# Patient Record
Sex: Female | Born: 2000 | Hispanic: Yes | Marital: Single | Smoking: Never smoker
Health system: Southern US, Community
[De-identification: ages and names within clinical notes are randomized; demographics above are authoritative.]

## PROBLEM LIST (undated history)

## (undated) DIAGNOSIS — Z789 Other specified health status: Secondary | ICD-10-CM

## (undated) HISTORY — PX: NO PAST SURGERIES: SHX2092

---
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
## 2019-02-01 ENCOUNTER — Encounter: Payer: Commercial Managed Care - PPO | Admitting: Family Medicine

## 2019-03-05 ENCOUNTER — Other Ambulatory Visit: Payer: Self-pay

## 2019-03-05 DIAGNOSIS — N946 Dysmenorrhea, unspecified: Secondary | ICD-10-CM

## 2019-03-05 MED ORDER — NORGESTREL-ETHINYL ESTRADIOL 0.3-30 MG-MCG PO TABS: 1.0000 | ORAL_TABLET | Freq: Every day | ORAL | 1 refills | Status: DC

## 2019-05-01 ENCOUNTER — Ambulatory Visit (INDEPENDENT_AMBULATORY_CARE_PROVIDER_SITE_OTHER): Payer: Commercial Managed Care - PPO | Admitting: Family Medicine

## 2019-05-01 ENCOUNTER — Encounter: Payer: Self-pay | Admitting: Family Medicine

## 2019-05-01 ENCOUNTER — Other Ambulatory Visit: Payer: Self-pay

## 2019-05-01 VITALS — BP 110/74 | HR 78 | Temp 98.8°F | Resp 18 | Ht 65.0 in | Wt 212.2 lb

## 2019-05-01 DIAGNOSIS — L83 Acanthosis nigricans: Secondary | ICD-10-CM

## 2019-05-01 DIAGNOSIS — Z3041 Encounter for surveillance of contraceptive pills: Secondary | ICD-10-CM

## 2019-05-01 DIAGNOSIS — Z0001 Encounter for general adult medical examination with abnormal findings: Secondary | ICD-10-CM | POA: Diagnosis not present

## 2019-05-01 DIAGNOSIS — Z833 Family history of diabetes mellitus: Secondary | ICD-10-CM

## 2019-05-01 DIAGNOSIS — Z113 Encounter for screening for infections with a predominantly sexual mode of transmission: Secondary | ICD-10-CM

## 2019-05-01 DIAGNOSIS — D573 Sickle-cell trait: Secondary | ICD-10-CM

## 2019-05-01 DIAGNOSIS — E669 Obesity, unspecified: Secondary | ICD-10-CM | POA: Insufficient documentation

## 2019-05-01 DIAGNOSIS — Z6835 Body mass index (BMI) 35.0-35.9, adult: Secondary | ICD-10-CM

## 2019-05-01 DIAGNOSIS — Z0000 Encounter for general adult medical examination without abnormal findings: Secondary | ICD-10-CM

## 2019-05-01 DIAGNOSIS — E6609 Other obesity due to excess calories: Secondary | ICD-10-CM

## 2019-05-01 MED ORDER — NORGESTREL-ETHINYL ESTRADIOL 0.3-30 MG-MCG PO TABS: 1.0000 | ORAL_TABLET | Freq: Every day | ORAL | 3 refills | Status: DC

## 2019-05-01 NOTE — Patient Instructions (Addendum)
Schedule for fasting labs We will call with results

## 2019-05-01 NOTE — Progress Notes (Signed)
   Subjective:    Patient ID: Michelle Carr, female    DOB: 2001-03-22, 18 y.o.   MRN: [id number]  Patient presents for Annual Exam   Pt here for CPE [facility]- 2nd year, pre-nursing     My Eye doctor- has glasses, has astigmatism   Dentist- up to date    Has allergy shellfish     Taking birth control LMP 5/24 last 3 days, regular    1  Female partner- sexually active      No vitamins or supplements    Has family history of HTN,DM     Has sickle cell trait- told her count was high            Mother also concerned about diabetes as she has noticed a darkening rash on her neck for greater than 6 months. Mitts that she eats out a lot she is not been eating very healthy food she does not exercise      iMMUNIZATIONS UTD   Review Of Systems:  GEN- denies fatigue, fever, weight loss,weakness, recent illness HEENT- denies eye drainage, change in vision, nasal discharge, CVS- denies chest pain, palpitations RESP- denies SOB, cough, wheeze ABD- denies N/V, change in stools, abd pain GU- denies dysuria, hematuria, dribbling, incontinence MSK- denies joint pain, muscle aches, injury Neuro- denies headache, dizziness, syncope, seizure activity       Objective:    BP 110/74   Pulse 78   Temp 98.8 F (37.1 C)   Resp 18   Ht 5\' 5"  (1.651 m)   Wt 212 lb 3.2 oz (96.3 kg)   LMP 04/21/2019   BMI 35.31 kg/m  GEN- NAD, alert and oriented x3 HEENT- PERRL, EOMI, non injected sclera, pink conjunctiva, MMM, oropharynx clear Neck- Supple, no thyromegaly CVS- RRR, no murmur RESP-CTAB ABD-NABS,soft,NT,ND Skin mild acanthosis posterior neck Psych- normal affect and mood  EXT- No edema Pulses- Radial, DP- 2+        Assessment & Plan:      Problem List Items Addressed This Visit      Unprioritized   Family history of diabetes mellitus   Obesity    Other Visit Diagnoses    Routine general medical examination at a health care facility    -  Primary   Screen for STD  (sexually transmitted disease)       Sickle cell trait (HCC)       Encounter for surveillance of contraceptive pills       Relevant Medications   norgestrel-ethinyl estradiol (LO/OVRAL) 0.3-30 MG-MCG tablet      Note: This dictation was prepared with Dragon dictation along with smaller phrase technology. Any transcriptional errors that result from this process are unintentional.

## 2019-05-03 LAB — C. TRACHOMATIS/N. GONORRHOEAE RNA
C. trachomatis RNA, TMA: NOT DETECTED
N. gonorrhoeae RNA, TMA: NOT DETECTED

## 2019-05-03 LAB — TRICHOMONAS VAGINALIS, PROBE AMP: Trichomonas vaginalis RNA: NOT DETECTED

## 2019-05-06 ENCOUNTER — Other Ambulatory Visit: Payer: Commercial Managed Care - PPO

## 2019-05-06 ENCOUNTER — Other Ambulatory Visit: Payer: Self-pay

## 2019-05-06 DIAGNOSIS — Z113 Encounter for screening for infections with a predominantly sexual mode of transmission: Secondary | ICD-10-CM

## 2019-05-06 DIAGNOSIS — Z0000 Encounter for general adult medical examination without abnormal findings: Secondary | ICD-10-CM

## 2019-05-06 DIAGNOSIS — Z833 Family history of diabetes mellitus: Secondary | ICD-10-CM

## 2019-05-06 DIAGNOSIS — D573 Sickle-cell trait: Secondary | ICD-10-CM

## 2019-05-06 DIAGNOSIS — L83 Acanthosis nigricans: Secondary | ICD-10-CM

## 2019-05-06 DIAGNOSIS — Z3041 Encounter for surveillance of contraceptive pills: Secondary | ICD-10-CM

## 2019-05-07 LAB — COMPREHENSIVE METABOLIC PANEL
AG Ratio: 1.5 (calc) (ref 1.0–2.5)
ALT: 15 U/L (ref 5–32)
AST: 17 U/L (ref 12–32)
Albumin: 4.3 g/dL (ref 3.6–5.1)
Alkaline phosphatase (APISO): 68 U/L (ref 36–128)
BUN: 8 mg/dL (ref 7–20)
CO2: 27 mmol/L (ref 20–32)
Calcium: 9.8 mg/dL (ref 8.9–10.4)
Chloride: 103 mmol/L (ref 98–110)
Creat: 0.67 mg/dL (ref 0.50–1.00)
Globulin: 2.8 g/dL (calc) (ref 2.0–3.8)
Glucose, Bld: 129 mg/dL — ABNORMAL HIGH (ref 65–99)
Potassium: 4.6 mmol/L (ref 3.8–5.1)
Sodium: 139 mmol/L (ref 135–146)
Total Bilirubin: 0.5 mg/dL (ref 0.2–1.1)
Total Protein: 7.1 g/dL (ref 6.3–8.2)

## 2019-05-07 LAB — CBC WITH DIFFERENTIAL/PLATELET
Absolute Monocytes: 428 cells/uL (ref 200–950)
Basophils Absolute: 50 cells/uL (ref 0–200)
Basophils Relative: 0.6 %
Eosinophils Absolute: 151 cells/uL (ref 15–500)
Eosinophils Relative: 1.8 %
HCT: 39.3 % (ref 35.0–45.0)
Hemoglobin: 13.4 g/dL (ref 11.7–15.5)
Lymphs Abs: 2150 cells/uL (ref 850–3900)
MCH: 28.8 pg (ref 27.0–33.0)
MCHC: 34.1 g/dL (ref 32.0–36.0)
MCV: 84.3 fL (ref 80.0–100.0)
MPV: 11.1 fL (ref 7.5–12.5)
Monocytes Relative: 5.1 %
Neutro Abs: 5620 cells/uL (ref 1500–7800)
Neutrophils Relative %: 66.9 %
Platelets: 277 10*3/uL (ref 140–400)
RBC: 4.66 10*6/uL (ref 3.80–5.10)
RDW: 12 % (ref 11.0–15.0)
Total Lymphocyte: 25.6 %
WBC: 8.4 10*3/uL (ref 3.8–10.8)

## 2019-05-07 LAB — LIPID PANEL
Cholesterol: 157 mg/dL (ref <170)
HDL: 44 mg/dL — ABNORMAL LOW (ref >45)
LDL Cholesterol (Calc): 93 mg/dL (calc) (ref <110)
Non-HDL Cholesterol (Calc): 113 mg/dL (calc) (ref <120)
Total CHOL/HDL Ratio: 3.6 (calc) (ref <5.0)
Triglycerides: 107 mg/dL — ABNORMAL HIGH (ref <90)

## 2019-05-07 LAB — RETICULOCYTES
ABS Retic: 135140 cells/uL — ABNORMAL HIGH (ref 20000–8000)
Retic Ct Pct: 2.9 %

## 2019-05-07 LAB — HEMOGLOBIN A1C
Hgb A1c MFr Bld: 4.8 % of total Hgb (ref <5.7)
Mean Plasma Glucose: 91 (calc)
eAG (mmol/L): 5 (calc)

## 2019-05-07 LAB — IRON: Iron: 107 ug/dL (ref 27–164)

## 2019-05-07 LAB — RPR: RPR Ser Ql: NONREACTIVE

## 2019-05-07 LAB — HIV ANTIBODY (ROUTINE TESTING W REFLEX): HIV 1&2 Ab, 4th Generation: NONREACTIVE

## 2019-11-13 ENCOUNTER — Ambulatory Visit (INDEPENDENT_AMBULATORY_CARE_PROVIDER_SITE_OTHER): Payer: Commercial Managed Care - PPO | Admitting: Family Medicine

## 2019-11-13 ENCOUNTER — Other Ambulatory Visit: Payer: Self-pay

## 2019-11-13 ENCOUNTER — Encounter: Payer: Self-pay | Admitting: Family Medicine

## 2019-11-13 VITALS — BP 118/64 | HR 90 | Temp 98.5°F | Resp 14 | Ht 65.0 in | Wt 216.0 lb

## 2019-11-13 DIAGNOSIS — L600 Ingrowing nail: Secondary | ICD-10-CM

## 2019-11-13 MED ORDER — SULFAMETHOXAZOLE-TRIMETHOPRIM 800-160 MG PO TABS: 1.0000 | ORAL_TABLET | Freq: Two times a day (BID) | ORAL | 0 refills | Status: DC

## 2019-11-13 NOTE — Progress Notes (Signed)
   Subjective:    Patient ID: Michelle Carr, female    DOB: August 15, 2001, 18 y.o.   MRN: [id number]  Patient presents for Nail Issues (L foot great toe and 2cd toe- swelling and leaking pus on sides of nails)  Here with redness and drainage from the site of her left great foot and second toenail.  States that she has had redness and swelling on and off for a while now.  She does get pedicures but will often have in between pedicures.  She has never had pus come out before her last pedicure was 2 weeks ago.  She has some tenderness around the nail but no pain in the actual foot ankle joint or above.  No fever or chills.   Review Of Systems:  GEN- denies fatigue, fever, weight loss,weakness, recent illness CVS- denies chest pain, palpitations RESP- denies SOB, cough, wheezee MSK- denies joint pain, muscle aches, injury Neuro- denies headache, dizziness, syncope, seizure activity       Objective:    BP 118/64   Pulse 90   Temp 98.5 F (36.9 C) (Temporal)   Resp 14   Ht 5\' 5"  (1.651 m)   Wt 216 lb (98 kg)   SpO2 96%   BMI 35.94 kg/m  GEN- NAD, alert and oriented x3 Skin-right foot skin intact no rash nails normal appearing, left great toe erythema on the medial aspect of the great toe ingrown nail tenderness to palpation pus expressed at the side of the nail, second digit swelling around the lateral aspect of the nail mild erythema no pus expressed EXT- No edema Pulses-  DP- 2+        Assessment & Plan:      Problem List Items Addressed This Visit    None    Visit Diagnoses    Ingrown toenail with infection    -  Primary   Infected ingrown nail.  We will have her use Epson salt soaks antibacterial soap.  We will start Bactrim.  I was able to express a small amount of pus left.  Since she has recurrent episodes with her nails and the ingrown appearance recommend lifting up the toenails using dental floss especially the great toe.  And will allow the nail to grow out longer  instead of keeping them cut very short.  We will calm down the infection per above.  If this does not improve her pain then she should return for partial nail removal.   Relevant Medications   sulfamethoxazole-trimethoprim (BACTRIM DS) 800-160 MG tablet      Note: This dictation was prepared with Dragon dictation along with smaller phrase technology. Any transcriptional errors that result from this process are unintentional.

## 2019-11-13 NOTE — Patient Instructions (Signed)
Epson salt soaks Use dental floss to lift up nail Antibiotics as prescribed Call if this does not improve

## 2019-11-20 ENCOUNTER — Emergency Department (HOSPITAL_BASED_OUTPATIENT_CLINIC_OR_DEPARTMENT_OTHER)
Admission: EM | Admit: 2019-11-20 | Discharge: 2019-11-21 | Disposition: A | Discharge status: Home / Self Care | Payer: Commercial Managed Care - PPO | Attending: Emergency Medicine | Admitting: Emergency Medicine

## 2019-11-20 ENCOUNTER — Other Ambulatory Visit: Payer: Self-pay

## 2019-11-20 ENCOUNTER — Encounter (HOSPITAL_BASED_OUTPATIENT_CLINIC_OR_DEPARTMENT_OTHER): Payer: Self-pay | Admitting: *Deleted

## 2019-11-20 DIAGNOSIS — Z79899 Other long term (current) drug therapy: Secondary | ICD-10-CM | POA: Insufficient documentation

## 2019-11-20 DIAGNOSIS — M79675 Pain in left toe(s): Secondary | ICD-10-CM | POA: Diagnosis present

## 2019-11-20 DIAGNOSIS — L03032 Cellulitis of left toe: Secondary | ICD-10-CM | POA: Diagnosis not present

## 2019-11-20 NOTE — ED Triage Notes (Signed)
Pt c/o toe infection left foot started ABX on 12/16, w/o improvement

## 2019-11-20 NOTE — ED Provider Notes (Signed)
[facility] EMERGENCY DEPARTMENT Provider Note   CSN: [id number] Arrival date & time: 11/20/19  2327   History Chief Complaint  Patient presents with  . Wound Infection    Michelle Carr is a 18 y.o. female.  The history is provided by the patient.  She was treated about a week ago for infection in her left great toe and second toe.  The great toe has gotten better.  The second toe has had some slight purulent drainage each day.  Tonight, there was a great deal more drainage as well as some bleeding.  Bleeding stopped just prior to her arriving in the ED.  She has had pain in that toe which has improved since it burst today.  He denies fever, chills, sweats.  History reviewed. No pertinent past medical history.  Patient Active Problem List   Diagnosis Date Noted  . Family history of diabetes mellitus 05/01/2019  . Obesity 05/01/2019  . Dysmenorrhea 01/04/2018    History reviewed. No pertinent surgical history.   OB History   No obstetric history on file.     Family History  Problem Relation Age of Onset  . Hyperlipidemia Mother   . Hyperlipidemia Maternal Grandmother   . Diabetes Maternal Grandmother   . Hypertension Maternal Grandmother     Social History   Tobacco Use  . Smoking status: Never Smoker  . Smokeless tobacco: Never Used  Substance Use Topics  . Alcohol use: No  . Drug use: No    Home Medications Prior to Admission medications   Medication Sig Start Date End Date Taking? Authorizing Provider  norgestrel-ethinyl estradiol (LO/OVRAL) 0.3-30 MG-MCG tablet Take 1 tablet by mouth daily. 05/01/19   Fennimore, Velna Hatchet, MD  sulfamethoxazole-trimethoprim (BACTRIM DS) 800-160 MG tablet Take 1 tablet by mouth 2 (two) times daily. 11/13/19   Salley Scarlet, MD    Allergies    Shellfish allergy  Review of Systems   Review of Systems  All other systems reviewed and are negative.   Physical Exam Updated Vital Signs BP 136/81   Pulse 92    Temp 98.2 F (36.8 C)   Resp 16   Ht 5\' 5"  (1.651 m)   Wt 97 kg   LMP 11/16/2019   SpO2 98%   BMI 35.59 kg/m   Physical Exam Vitals and nursing note reviewed.    18 year old female, resting comfortably and in no acute distress. Vital signs are normal. Oxygen saturation is 98%, which is normal. Head is normocephalic and atraumatic. PERRLA, EOMI. Oropharynx is clear. Neck is nontender and supple without adenopathy or JVD. Back is nontender and there is no CVA tenderness. Lungs are clear without rales, wheezes, or rhonchi. Chest is nontender. Heart has regular rate and rhythm without murmur. Abdomen is soft, flat, nontender without masses or hepatosplenomegaly and peristalsis is normoactive. Extremities: No swelling noted of the left first or second toes.  There does appear to be a site where a paronychia had spontaneously drained on the second toe.  There is no erythema or warmth and there is only minimal tenderness.  Remainder of extremity exam is normal. Skin is warm and dry without rash. Neurologic: Mental status is normal, cranial nerves are intact, there are no motor or sensory deficits.  ED Results / Procedures / Treatments    Procedures Procedures  Medications Ordered in ED Medications - No data to display  ED Course  I have reviewed the triage vital signs and the nursing notes.  MDM Rules/Calculators/A&P Paronychia of the left second toe which has spontaneously drained.  No indications for any intervention currently.  Patient is advised to soak in warm water several times a day but advised of possibility of recurrence of paronychia at which time she would need to return for incision and drainage.  Old records are reviewed, and she did have an office visit on 12/16 with diagnosis of an ingrown toenail of the left great toe treated with trimethoprim-sulfamethoxazole.  Final Clinical Impression(s) / ED Diagnoses Final diagnoses:  Paronychia of second toe of left foot     Rx / DC Orders ED Discharge Orders    None       Delora Fuel, MD 48/88/91 2351

## 2019-11-20 NOTE — Discharge Instructions (Addendum)
Soak your foot in warm water 2-3 times a day.  Return if the swelling comes back.

## 2020-02-05 ENCOUNTER — Other Ambulatory Visit: Payer: Self-pay

## 2020-02-05 ENCOUNTER — Encounter: Payer: Self-pay | Admitting: Family Medicine

## 2020-02-05 ENCOUNTER — Ambulatory Visit (INDEPENDENT_AMBULATORY_CARE_PROVIDER_SITE_OTHER): Payer: Commercial Managed Care - PPO | Admitting: Family Medicine

## 2020-02-05 VITALS — BP 116/82 | HR 90 | Temp 98.2°F | Resp 14 | Ht 65.0 in | Wt 218.0 lb

## 2020-02-05 DIAGNOSIS — E6609 Other obesity due to excess calories: Secondary | ICD-10-CM

## 2020-02-05 DIAGNOSIS — Z6836 Body mass index (BMI) 36.0-36.9, adult: Secondary | ICD-10-CM

## 2020-02-05 DIAGNOSIS — Z113 Encounter for screening for infections with a predominantly sexual mode of transmission: Secondary | ICD-10-CM | POA: Diagnosis not present

## 2020-02-05 DIAGNOSIS — Z124 Encounter for screening for malignant neoplasm of cervix: Secondary | ICD-10-CM

## 2020-02-05 DIAGNOSIS — Z0000 Encounter for general adult medical examination without abnormal findings: Secondary | ICD-10-CM | POA: Diagnosis not present

## 2020-02-05 LAB — WET PREP FOR TRICH, YEAST, CLUE

## 2020-02-05 MED ORDER — METRONIDAZOLE 500 MG PO TABS: 500.0000 mg | ORAL_TABLET | Freq: Two times a day (BID) | ORAL | 0 refills | Status: DC

## 2020-02-05 NOTE — Addendum Note (Signed)
Addended by: Milinda Antis F on: 02/05/2020 05:24 PM   Modules accepted: Orders

## 2020-02-05 NOTE — Progress Notes (Signed)
   Subjective:    Patient ID: Michelle Carr, female    DOB: 08/14/2001, 19 y.o.   MRN: [id number]  Patient presents for Gynecologic Exam (is fasting- would like cultures)   Pt here for CPE    LMP 01/12/20    No particular concerns today.  She would like a GYN exam performed she does have one partner.  She would also like STD check.  She has had some vaginal discharge with mild odor she is concerned about bacterial vaginosis. He is using condoms for contraception    Has dental appt coming up, wants to get Invisilign    Needs eye appt   SHe is working full-time./ She is not exercising plans to start walking at a local park She is also working on dietary changes to reduce her weight with family history of diabetes mellitus  Tdap up-to-date Review Of Systems:  GEN- denies fatigue, fever, weight loss,weakness, recent illness HEENT- denies eye drainage, change in vision, nasal discharge, CVS- denies chest pain, palpitations RESP- denies SOB, cough, wheeze ABD- denies N/V, change in stools, abd pain GU- denies dysuria, hematuria, dribbling, incontinence MSK- denies joint pain, muscle aches, injury Neuro- denies headache, dizziness, syncope, seizure activity       Objective:    BP 116/82   Pulse 90   Temp 98.2 F (36.8 C) (Temporal)   Resp 14   Ht 5\' 5"  (1.651 m)   Wt 218 lb (98.9 kg)   LMP 01/12/2020 Comment: regular  SpO2 98%   BMI 36.28 kg/m  GEN- NAD, alert and oriented x3 HEENT- PERRL, EOMI, non injected sclera, pink conjunctiva, MMM, oropharynx clear Neck- Supple, no thyromegaly Breast- normal symmetry, no nipple inversion,no nipple drainage, no nodules or lumps felt Nodes- no axillary nodes CVS- RRR, no murmur RESP-CTAB ABD-NABS,soft,NT,ND GU- normal external genitalia, vaginal mucosa pink and moist, cervix visualized no growth, no blood form os, + discharge, no CMT, no ovarian masses, uterus normal size EXT- No edema Pulses- Radial, DP- 2+        Assessment  & Plan:      Problem List Items Addressed This Visit      Unprioritized   Obesity    Other Visit Diagnoses    Routine general medical examination at a health care facility    -  Primary   CPE done.  STD screening done along with Pap smear.  Fasting labs.  Discussed dietary changes increasing activity   Relevant Orders   CBC with Differential/Platelet   Comprehensive metabolic panel   Lipid panel   HIV Antibody (routine testing w rflx)   Cervical cancer screening       Relevant Orders   Pap IG w/ reflex to HPV when ASC-U   Screen for STD (sexually transmitted disease)       Relevant Orders   HIV Antibody (routine testing w rflx)   RPR   C. trachomatis/N. gonorrhoeae RNA   WET PREP FOR TRICH, YEAST, CLUE (Completed)      Note: This dictation was prepared with Dragon dictation along with smaller 01/14/2020. Any transcriptional errors that result from this process are unintentional.

## 2020-02-05 NOTE — Patient Instructions (Signed)
F/u 1 YEAR FOR PHYSICAL  We will call with lab results

## 2020-02-06 LAB — COMPREHENSIVE METABOLIC PANEL
AG Ratio: 1.6 (calc) (ref 1.0–2.5)
ALT: 14 U/L (ref 6–29)
AST: 14 U/L (ref 10–30)
Albumin: 4.3 g/dL (ref 3.6–5.1)
Alkaline phosphatase (APISO): 99 U/L (ref 31–125)
BUN: 10 mg/dL (ref 7–25)
CO2: 27 mmol/L (ref 20–32)
Calcium: 10 mg/dL (ref 8.6–10.2)
Chloride: 102 mmol/L (ref 98–110)
Creat: 0.59 mg/dL (ref 0.50–1.10)
Globulin: 2.7 g/dL (calc) (ref 1.9–3.7)
Glucose, Bld: 80 mg/dL (ref 65–99)
Potassium: 4.3 mmol/L (ref 3.5–5.3)
Sodium: 138 mmol/L (ref 135–146)
Total Bilirubin: 0.6 mg/dL (ref 0.2–1.2)
Total Protein: 7 g/dL (ref 6.1–8.1)

## 2020-02-06 LAB — CBC WITH DIFFERENTIAL/PLATELET
Absolute Monocytes: 498 cells/uL (ref 200–950)
Basophils Absolute: 40 cells/uL (ref 0–200)
Basophils Relative: 0.5 %
Eosinophils Absolute: 166 cells/uL (ref 15–500)
Eosinophils Relative: 2.1 %
HCT: 40.7 % (ref 35.0–45.0)
Hemoglobin: 13.7 g/dL (ref 11.7–15.5)
Lymphs Abs: 2054 cells/uL (ref 850–3900)
MCH: 28.4 pg (ref 27.0–33.0)
MCHC: 33.7 g/dL (ref 32.0–36.0)
MCV: 84.3 fL (ref 80.0–100.0)
MPV: 11 fL (ref 7.5–12.5)
Monocytes Relative: 6.3 %
Neutro Abs: 5143 cells/uL (ref 1500–7800)
Neutrophils Relative %: 65.1 %
Platelets: 295 10*3/uL (ref 140–400)
RBC: 4.83 10*6/uL (ref 3.80–5.10)
RDW: 12.4 % (ref 11.0–15.0)
Total Lymphocyte: 26 %
WBC: 7.9 10*3/uL (ref 3.8–10.8)

## 2020-02-06 LAB — HIV ANTIBODY (ROUTINE TESTING W REFLEX): HIV 1&2 Ab, 4th Generation: NONREACTIVE

## 2020-02-06 LAB — PAP IG W/ RFLX HPV ASCU

## 2020-02-06 LAB — LIPID PANEL
Cholesterol: 187 mg/dL (ref <200)
HDL: 41 mg/dL — ABNORMAL LOW (ref >=50)
LDL Cholesterol (Calc): 127 mg/dL (calc) — ABNORMAL HIGH
Non-HDL Cholesterol (Calc): 146 mg/dL (calc) — ABNORMAL HIGH (ref <130)
Total CHOL/HDL Ratio: 4.6 (calc) (ref <5.0)
Triglycerides: 89 mg/dL (ref <150)

## 2020-02-06 LAB — C. TRACHOMATIS/N. GONORRHOEAE RNA
C. trachomatis RNA, TMA: NOT DETECTED
N. gonorrhoeae RNA, TMA: NOT DETECTED

## 2020-02-06 LAB — RPR: RPR Ser Ql: NONREACTIVE

## 2020-03-03 ENCOUNTER — Other Ambulatory Visit: Payer: Self-pay | Admitting: Family Medicine

## 2020-06-06 ENCOUNTER — Other Ambulatory Visit: Payer: Self-pay

## 2020-06-06 ENCOUNTER — Encounter (HOSPITAL_BASED_OUTPATIENT_CLINIC_OR_DEPARTMENT_OTHER): Payer: Self-pay | Admitting: Emergency Medicine

## 2020-06-06 ENCOUNTER — Emergency Department (HOSPITAL_BASED_OUTPATIENT_CLINIC_OR_DEPARTMENT_OTHER)
Admission: EM | Admit: 2020-06-06 | Discharge: 2020-06-06 | Disposition: A | Discharge status: Home / Self Care | Payer: Commercial Managed Care - PPO | Attending: Emergency Medicine | Admitting: Emergency Medicine

## 2020-06-06 DIAGNOSIS — R197 Diarrhea, unspecified: Secondary | ICD-10-CM | POA: Insufficient documentation

## 2020-06-06 DIAGNOSIS — R1010 Upper abdominal pain, unspecified: Secondary | ICD-10-CM | POA: Insufficient documentation

## 2020-06-06 MED ORDER — LOPERAMIDE HCL 2 MG PO CAPS
2.0000 mg | ORAL_CAPSULE | Freq: Four times a day (QID) | ORAL | 0 refills | Status: DC | PRN
Start: 2020-06-06 — End: 2020-08-20

## 2020-06-06 MED ORDER — ACETAMINOPHEN 500 MG PO TABS
500.0000 mg | ORAL_TABLET | Freq: Four times a day (QID) | ORAL | 0 refills | Status: DC | PRN
Start: 2020-06-06 — End: 2020-08-20

## 2020-06-06 MED ORDER — ALUM & MAG HYDROXIDE-SIMETH 200-200-20 MG/5ML PO SUSP
30.0000 mL | Freq: Once | ORAL | Status: DC
  Filled 2020-06-06: qty 30

## 2020-06-06 MED ORDER — FAMOTIDINE 20 MG PO TABS
20.0000 mg | ORAL_TABLET | Freq: Once | ORAL | Status: AC
  Administered 2020-06-06: 20 mg via ORAL
  Filled 2020-06-06: qty 1

## 2020-06-06 MED ORDER — FAMOTIDINE 20 MG PO TABS
20.0000 mg | ORAL_TABLET | Freq: Two times a day (BID) | ORAL | 0 refills | Status: DC
Start: 2020-06-06 — End: 2020-08-20

## 2020-06-06 MED ORDER — LIDOCAINE VISCOUS HCL 2 % MT SOLN
15.0000 mL | Freq: Once | OROMUCOSAL | Status: DC
  Filled 2020-06-06: qty 15

## 2020-06-06 NOTE — Discharge Instructions (Signed)
1. Medications: Can take Tylenol every 6 hours as needed for pain.  Do not exceed more than 4000 mg of Tylenol daily.  Takes Pepcid twice daily with meals for abdominal discomfort.  Take Imodium as needed for diarrhea. 2. Treatment: rest, drink plenty of fluids, advance diet slowly.  Start with water and broth then advance to bland foods that will not upset your stomach such as crackers, mashed potatoes, and peanut butter. 3. Follow Up: Please followup with your primary doctor in 3 days for discussion of your diagnoses and further evaluation after today's visit; if you do not have a primary care doctor use the resource guide provided to find one; Please return to the ER for persistent vomiting, high fevers or worsening symptoms

## 2020-06-06 NOTE — ED Provider Notes (Signed)
[facility] EMERGENCY DEPARTMENT Provider Note   CSN: [id number] Arrival date & time: 06/06/20  2008     History Chief Complaint  Patient presents with  . Abdominal Pain    Michelle Carr is a 19 y.o. female presents today for evaluation of acute onset, intermittent upper abdominal pain beginning upon awakening this morning at around 6 AM.  Reports the pain is aching, mostly localized to the upper abdomen.  It worsens laying on her side.  She denies nausea, vomiting, fevers, chest pain, shortness of breath, urinary symptoms, vaginal itching, bleeding, or discharge.  She has had 7 or 8 episodes of watery nonbloody diarrhea.  Last night she ate [place] food late at night and attributes her symptoms to this.  She has been able to tolerate bland foods today without difficulty.  She states that she had to miss work today due to her symptoms and is requesting a work note.  HPI     History reviewed. No pertinent past medical history.  Patient Active Problem List   Diagnosis Date Noted  . Family history of diabetes mellitus 05/01/2019  . Obesity 05/01/2019  . Dysmenorrhea 01/04/2018    History reviewed. No pertinent surgical history.   OB History   No obstetric history on file.     Family History  Problem Relation Age of Onset  . Hyperlipidemia Mother   . Hyperlipidemia Maternal Grandmother   . Diabetes Maternal Grandmother   . Hypertension Maternal Grandmother     Social History   Tobacco Use  . Smoking status: Never Smoker  . Smokeless tobacco: Never Used  Vaping Use  . Vaping Use: Never used  Substance Use Topics  . Alcohol use: No  . Drug use: No    Home Medications Prior to Admission medications   Medication Sig Start Date End Date Taking? Authorizing Provider  acetaminophen (TYLENOL) 500 MG tablet Take 1 tablet (500 mg total) by mouth every 6 (six) hours as needed. 06/06/20   Antawan Mchugh A, PA-C  famotidine (PEPCID) 20 MG tablet Take 1 tablet (20 mg  total) by mouth 2 (two) times daily. 06/06/20   Ross Hefferan A, PA-C  loperamide (IMODIUM) 2 MG capsule Take 1 capsule (2 mg total) by mouth 4 (four) times daily as needed for diarrhea or loose stools. 06/06/20   Carleah Yablonski A, PA-C  metroNIDAZOLE (FLAGYL) 500 MG tablet Take 1 tablet (500 mg total) by mouth 2 (two) times daily. 02/05/20   Salley Scarlet, MD    Allergies    Shellfish allergy  Review of Systems   Review of Systems  Constitutional: Negative for chills and fever.  Respiratory: Negative for shortness of breath.   Cardiovascular: Negative for chest pain.  Gastrointestinal: Positive for abdominal pain and diarrhea. Negative for blood in stool, nausea and vomiting.  Genitourinary: Negative for dysuria, frequency, urgency, vaginal bleeding and vaginal pain.  All other systems reviewed and are negative.   Physical Exam Updated Vital Signs BP 117/70 (BP Location: Right Arm)   Pulse 98   Temp 98.5 F (36.9 C) (Oral)   Resp 16   Ht 5\' 5"  (1.651 m)   Wt 90.7 kg   LMP 05/19/2020   SpO2 98%   BMI 33.28 kg/m   Physical Exam Vitals and nursing note reviewed.  Constitutional:      General: She is not in acute distress.    Appearance: She is well-developed.     Comments: Resting comfortably in bed  HENT:  Head: Normocephalic and atraumatic.  Eyes:     General:        Right eye: No discharge.        Left eye: No discharge.     Conjunctiva/sclera: Conjunctivae normal.  Neck:     Vascular: No JVD.     Trachea: No tracheal deviation.  Cardiovascular:     Rate and Rhythm: Normal rate and regular rhythm.  Pulmonary:     Effort: Pulmonary effort is normal.     Breath sounds: Normal breath sounds.  Abdominal:     General: Abdomen is flat. Bowel sounds are normal. There is no distension.     Palpations: Abdomen is soft.     Tenderness: There is no abdominal tenderness. There is no right CVA tenderness, left CVA tenderness, guarding or rebound. Negative signs include  Murphy's sign.  Skin:    Findings: No erythema.  Neurological:     Mental Status: She is alert.  Psychiatric:        Behavior: Behavior normal.     ED Results / Procedures / Treatments   Labs (all labs ordered are listed, but only abnormal results are displayed) Labs Reviewed - No data to display  EKG None  Radiology No results found.  Procedures Procedures (including critical care time)  Medications Ordered in ED Medications  alum & mag hydroxide-simeth (MAALOX/MYLANTA) 200-200-20 MG/5ML suspension 30 mL (30 mLs Oral Refused 06/06/20 2135)    And  lidocaine (XYLOCAINE) 2 % viscous mouth solution 15 mL (15 mLs Oral Refused 06/06/20 2135)  famotidine (PEPCID) tablet 20 mg (20 mg Oral Given 06/06/20 2134)    ED Course  I have reviewed the triage vital signs and the nursing notes.  Pertinent labs & imaging results that were available during my care of the patient were reviewed by me and considered in my medical decision making (see chart for details).    MDM Rules/Calculators/A&P                          Patient presenting for evaluation of upper abdominal discomfort and diarrhea beginning after eating [place] food late last night.  She is afebrile, vital signs are stable.  She is nontoxic in appearance.  Her abdomen is soft and entirely nontender with no rebound or guarding.  She has been able to tolerate p.o. food and fluids without difficulty today.  She is overall quite well-appearing.  Given reassuring examination I have a very low suspicion of acute surgical abdominal pathology including obstruction, perforation, appendicitis, cholecystitis, or GU pathology including pyelonephritis, PID, TOA, ovarian torsion or ectopic pregnancy.  We discussed potential management options including lab work and imaging and we had a shared decision-making conversation.  She elects to hold off on lab work and imaging at this time.  We discussed symptomatic management of what sounds like GI  illness with Tylenol, Imodium for diarrhea, Pepcid for gastritis type symptoms.  She was offered symptomatic management in the ED.  She is requesting a work note which I think is reasonable.  We discussed indications for return to the ED including fever, localizing pain, vomiting, or worsening pain.  Recommend follow-up with PCP for reevaluation of symptoms.  She verbalized understanding of and agreement with plan and is stable for discharge at this time. Final Clinical Impression(s) / ED Diagnoses Final diagnoses:  Pain of upper abdomen  Diarrhea in adult patient    Rx / DC Orders ED Discharge Orders  Ordered    famotidine (PEPCID) 20 MG tablet  2 times daily     Discontinue  Reprint     06/06/20 2123    acetaminophen (TYLENOL) 500 MG tablet  Every 6 hours PRN     Discontinue  Reprint     06/06/20 2123    loperamide (IMODIUM) 2 MG capsule  4 times daily PRN     Discontinue  Reprint     06/06/20 2123           Jeanie Sewer, PA-C 06/06/20 2140    Maia Plan, MD 06/13/20 2034

## 2020-06-06 NOTE — ED Triage Notes (Signed)
Patient states that she is having generalized abdominal pain that has been there since this am  - patient denies any N/V

## 2020-06-17 ENCOUNTER — Other Ambulatory Visit: Payer: Self-pay | Admitting: Family Medicine

## 2020-06-17 DIAGNOSIS — Z3041 Encounter for surveillance of contraceptive pills: Secondary | ICD-10-CM

## 2020-07-20 ENCOUNTER — Inpatient Hospital Stay (HOSPITAL_COMMUNITY): Payer: Medicaid Other

## 2020-07-20 ENCOUNTER — Encounter (HOSPITAL_BASED_OUTPATIENT_CLINIC_OR_DEPARTMENT_OTHER): Payer: Self-pay | Admitting: *Deleted

## 2020-07-20 ENCOUNTER — Inpatient Hospital Stay (HOSPITAL_BASED_OUTPATIENT_CLINIC_OR_DEPARTMENT_OTHER)
Admission: AD | Admit: 2020-07-20 | Discharge: 2020-07-21 | Disposition: A | Discharge status: Home / Self Care | Payer: Medicaid Other | Attending: Obstetrics and Gynecology | Admitting: Obstetrics and Gynecology

## 2020-07-20 ENCOUNTER — Other Ambulatory Visit: Payer: Self-pay

## 2020-07-20 DIAGNOSIS — Z8249 Family history of ischemic heart disease and other diseases of the circulatory system: Secondary | ICD-10-CM | POA: Insufficient documentation

## 2020-07-20 DIAGNOSIS — O26891 Other specified pregnancy related conditions, first trimester: Secondary | ICD-10-CM | POA: Diagnosis not present

## 2020-07-20 DIAGNOSIS — Z6834 Body mass index (BMI) 34.0-34.9, adult: Secondary | ICD-10-CM | POA: Diagnosis not present

## 2020-07-20 DIAGNOSIS — N83201 Unspecified ovarian cyst, right side: Secondary | ICD-10-CM | POA: Insufficient documentation

## 2020-07-20 DIAGNOSIS — O3481 Maternal care for other abnormalities of pelvic organs, first trimester: Secondary | ICD-10-CM | POA: Insufficient documentation

## 2020-07-20 DIAGNOSIS — O99211 Obesity complicating pregnancy, first trimester: Secondary | ICD-10-CM | POA: Diagnosis not present

## 2020-07-20 DIAGNOSIS — Z3A01 Less than 8 weeks gestation of pregnancy: Secondary | ICD-10-CM | POA: Diagnosis not present

## 2020-07-20 DIAGNOSIS — R109 Unspecified abdominal pain: Secondary | ICD-10-CM | POA: Insufficient documentation

## 2020-07-20 HISTORY — DX: Other specified health status: Z78.9

## 2020-07-20 LAB — URINALYSIS, ROUTINE W REFLEX MICROSCOPIC
Bilirubin Urine: NEGATIVE
Glucose, UA: NEGATIVE mg/dL
Hgb urine dipstick: NEGATIVE
Ketones, ur: NEGATIVE mg/dL
Leukocytes,Ua: NEGATIVE
Nitrite: NEGATIVE
Protein, ur: NEGATIVE mg/dL
Specific Gravity, Urine: 1.02 (ref 1.005–1.030)
pH: 7 (ref 5.0–8.0)

## 2020-07-20 LAB — CBC WITH DIFFERENTIAL/PLATELET
Abs Immature Granulocytes: 0.04 10*3/uL (ref 0.00–0.07)
Basophils Absolute: 0.1 10*3/uL (ref 0.0–0.1)
Basophils Relative: 1 %
Eosinophils Absolute: 0.2 10*3/uL (ref 0.0–0.5)
Eosinophils Relative: 2 %
HCT: 39.7 % (ref 36.0–46.0)
Hemoglobin: 13 g/dL (ref 12.0–15.0)
Immature Granulocytes: 0 %
Lymphocytes Relative: 27 %
Lymphs Abs: 2.9 10*3/uL (ref 0.7–4.0)
MCH: 28.4 pg (ref 26.0–34.0)
MCHC: 32.7 g/dL (ref 30.0–36.0)
MCV: 86.9 fL (ref 80.0–100.0)
Monocytes Absolute: 0.7 10*3/uL (ref 0.1–1.0)
Monocytes Relative: 6 %
Neutro Abs: 6.8 10*3/uL (ref 1.7–7.7)
Neutrophils Relative %: 64 %
Platelets: 310 10*3/uL (ref 150–400)
RBC: 4.57 MIL/uL (ref 3.87–5.11)
RDW: 11.9 % (ref 11.5–15.5)
WBC: 10.6 10*3/uL — ABNORMAL HIGH (ref 4.0–10.5)
nRBC: 0 % (ref 0.0–0.2)

## 2020-07-20 LAB — COMPREHENSIVE METABOLIC PANEL
ALT: 14 U/L (ref 0–44)
AST: 17 U/L (ref 15–41)
Albumin: 3.9 g/dL (ref 3.5–5.0)
Alkaline Phosphatase: 69 U/L (ref 38–126)
Anion gap: 9 (ref 5–15)
BUN: 8 mg/dL (ref 6–20)
CO2: 25 mmol/L (ref 22–32)
Calcium: 8.7 mg/dL — ABNORMAL LOW (ref 8.9–10.3)
Chloride: 103 mmol/L (ref 98–111)
Creatinine, Ser: 0.65 mg/dL (ref 0.44–1.00)
GFR calc Af Amer: >60 mL/min (ref >60)
GFR calc non Af Amer: >60 mL/min (ref >60)
Glucose, Bld: 91 mg/dL (ref 70–99)
Potassium: 3.7 mmol/L (ref 3.5–5.1)
Sodium: 137 mmol/L (ref 135–145)
Total Bilirubin: 0.1 mg/dL — ABNORMAL LOW (ref 0.3–1.2)
Total Protein: 7.5 g/dL (ref 6.5–8.1)

## 2020-07-20 LAB — WET PREP, GENITAL
Clue Cells Wet Prep HPF POC: NONE SEEN
Sperm: NONE SEEN
Trich, Wet Prep: NONE SEEN

## 2020-07-20 LAB — HCG, QUANTITATIVE, PREGNANCY: hCG, Beta Chain, Quant, S: 2211 m[IU]/mL — ABNORMAL HIGH (ref <5)

## 2020-07-20 LAB — PREGNANCY, URINE: Preg Test, Ur: POSITIVE — AB

## 2020-07-20 NOTE — MAU Provider Note (Signed)
History     CSN: [id number]  Arrival date and time: 07/20/20 1734   First Provider Initiated Contact with Patient 07/20/20 2233      Chief Complaint  Patient presents with  . Routine Prenatal Visit   Ms. Michelle Carr is a 19 y.o. year old G1P0 female at [redacted]w[redacted]d weeks gestation who was sent to MAU from [facility] to complete an ectopic pregnancy work-up. She went there with the complaint of lower abdominal pain for  "few hours"; rated 4/10. She has taken no medication to relieve the pain. She denies any vaginal bleeding or abnormal vaginal discharge.   OB History    Gravida  1   Para      Term      Preterm      AB      Living        SAB      TAB      Ectopic      Multiple      Live Births              Past Medical History:  Diagnosis Date  . Medical history non-contributory     Past Surgical History:  Procedure Laterality Date  . NO PAST SURGERIES      Family History  Problem Relation Age of Onset  . Hyperlipidemia Mother   . Hyperlipidemia Maternal Grandmother   . Diabetes Maternal Grandmother   . Hypertension Maternal Grandmother     Social History   Tobacco Use  . Smoking status: Never Smoker  . Smokeless tobacco: Never Used  Vaping Use  . Vaping Use: Never used  Substance Use Topics  . Alcohol use: No  . Drug use: No    Allergies:  Allergies  Allergen Reactions  . Shellfish Allergy     Medications Prior to Admission  Medication Sig Dispense Refill Last Dose  . prenatal vitamin w/FE, FA (PRENATAL 1 + 1) 27-1 MG TABS tablet Take 1 tablet by mouth daily at 12 noon.   07/20/2020 at Unknown time  . acetaminophen (TYLENOL) 500 MG tablet Take 1 tablet (500 mg total) by mouth every 6 (six) hours as needed. 30 tablet 0   . CRYSELLE-28 0.3-30 MG-MCG tablet Take 1 tablet by mouth daily 28 tablet 3   . famotidine (PEPCID) 20 MG tablet Take 1 tablet (20 mg total) by mouth 2 (two) times daily. 30 tablet 0   . loperamide (IMODIUM) 2  MG capsule Take 1 capsule (2 mg total) by mouth 4 (four) times daily as needed for diarrhea or loose stools. 12 capsule 0   . metroNIDAZOLE (FLAGYL) 500 MG tablet Take 1 tablet (500 mg total) by mouth 2 (two) times daily. 14 tablet 0     Review of Systems  Constitutional: Negative.   HENT: Negative.   Eyes: Negative.   Respiratory: Negative.   Cardiovascular: Negative.   Gastrointestinal: Negative.   Endocrine: Negative.   Genitourinary: Negative.   Musculoskeletal: Negative.   Skin: Negative.   Allergic/Immunologic: Negative.   Neurological: Negative.   Hematological: Negative.   Psychiatric/Behavioral: Negative.    Physical Exam   Blood pressure 125/69, pulse 82, temperature 98 F (36.7 C), resp. rate 16, height 5\' 5"  (1.651 m), weight 95.3 kg, last menstrual period 05/29/2020, SpO2 100 %.  Physical Exam  MAU Course  Procedures  MDM OB U/S <14 wks  *Lab results from [facility] below in black ink; lab results from MAU below in red ink Results  for orders placed or performed during the hospital encounter of 07/20/20 (from the past 24 hour(s))  Pregnancy, urine     Status: Abnormal   Collection Time: 07/20/20  6:06 PM  Result Value Ref Range   Preg Test, Ur POSITIVE (A) NEGATIVE  Urinalysis, Routine w reflex microscopic Urine, Clean Catch     Status: None   Collection Time: 07/20/20  6:06 PM  Result Value Ref Range   Color, Urine YELLOW YELLOW   APPearance CLEAR CLEAR   Specific Gravity, Urine 1.020 1.005 - 1.030   pH 7.0 5.0 - 8.0   Glucose, UA NEGATIVE NEGATIVE mg/dL   Hgb urine dipstick NEGATIVE NEGATIVE   Bilirubin Urine NEGATIVE NEGATIVE   Ketones, ur NEGATIVE NEGATIVE mg/dL   Protein, ur NEGATIVE NEGATIVE mg/dL   Nitrite NEGATIVE NEGATIVE   Leukocytes,Ua NEGATIVE NEGATIVE  CBC with Differential     Status: Abnormal   Collection Time: 07/20/20  8:43 PM  Result Value Ref Range   WBC 10.6 (H) 4.0 - 10.5 K/uL   RBC 4.57 3.87 - 5.11 MIL/uL   Hemoglobin 13.0  12.0 - 15.0 g/dL   HCT 09.9 36 - 46 %   MCV 86.9 80.0 - 100.0 fL   MCH 28.4 26.0 - 34.0 pg   MCHC 32.7 30.0 - 36.0 g/dL   RDW 83.3 82.5 - 05.3 %   Platelets 310 150 - 400 K/uL   nRBC 0.0 0.0 - 0.2 %   Neutrophils Relative % 64 %   Neutro Abs 6.8 1.7 - 7.7 K/uL   Lymphocytes Relative 27 %   Lymphs Abs 2.9 0.7 - 4.0 K/uL   Monocytes Relative 6 %   Monocytes Absolute 0.7 0 - 1 K/uL   Eosinophils Relative 2 %   Eosinophils Absolute 0.2 0 - 0 K/uL   Basophils Relative 1 %   Basophils Absolute 0.1 0 - 0 K/uL   Immature Granulocytes 0 %   Abs Immature Granulocytes 0.04 0.00 - 0.07 K/uL  Comprehensive metabolic panel     Status: Abnormal   Collection Time: 07/20/20  8:43 PM  Result Value Ref Range   Sodium 137 135 - 145 mmol/L   Potassium 3.7 3.5 - 5.1 mmol/L   Chloride 103 98 - 111 mmol/L   CO2 25 22 - 32 mmol/L   Glucose, Bld 91 70 - 99 mg/dL   BUN 8 6 - 20 mg/dL   Creatinine, Ser 9.76 0.44 - 1.00 mg/dL   Calcium 8.7 (L) 8.9 - 10.3 mg/dL   Total Protein 7.5 6.5 - 8.1 g/dL   Albumin 3.9 3.5 - 5.0 g/dL   AST 17 15 - 41 U/L   ALT 14 0 - 44 U/L   Alkaline Phosphatase 69 38 - 126 U/L   Total Bilirubin 0.1 (L) 0.3 - 1.2 mg/dL   GFR calc non Af Amer >60 >60 mL/min   GFR calc Af Amer >60 >60 mL/min   Anion gap 9 5 - 15  hCG, quantitative, pregnancy     Status: Abnormal   Collection Time: 07/20/20  8:43 PM  Result Value Ref Range   hCG, Beta Chain, Quant, S 2,211 (H) <5 mIU/mL  Wet prep, genital     Status: Abnormal   Collection Time: 07/20/20  9:49 PM   Specimen: PATH Cytology Cervicovaginal Ancillary Only  Result Value Ref Range   Yeast Wet Prep HPF POC PRESENT (A) NONE SEEN   Trich, Wet Prep NONE SEEN NONE SEEN   Clue Cells  Wet Prep HPF POC NONE SEEN NONE SEEN   WBC, Wet Prep HPF POC MANY (A) NONE SEEN   Sperm NONE SEEN   ABO/Rh     Status: None   Collection Time: 07/20/20 10:14 PM  Result Value Ref Range   ABO/RH(D)      A POS Performed at [facility], [address] [place], [place] [address]      US OB LESS THAN 14 WEEKS WITH [place] TRANSVAGINAL  Result Date: 07/21/2020 CLINICAL DATA:  19 year old female with positive HCG level and right pelvic pain. LMP: 05/29/2020 corresponding to an estimated gestational age of [redacted] weeks, 3 days. EXAM: OBSTETRIC <14 WK [place] AND TRANSVAGINAL OB US TECHNIQUE: Both transabdominal and transvaginal ultrasound examinations were performed for complete evaluation of the gestation as well as the maternal uterus, adnexal regions, and pelvic cul-de-sac. Transvaginal technique was performed to assess early pregnancy. COMPARISON:  None. FINDINGS: The uterus is retroverted and appears unremarkable. The endometrium measures approximately 1 cm. There is a gestational sac within the endometrium. Faint curvilinear structure within the gestational sac most consistent with a developing yolk sac. No fetal pole identified at this time. MSD: 5 mm   5 w   1 d Subchorionic hemorrhage:  None visualized. Maternal uterus/adnexae: The right ovary is enlarged. There is a corpus luteum in the right ovary. There is a 3.5 x 4.5 cm hemorrhagic cyst in the right ovary. The left ovary is unremarkable. There is moderate amount of minimally complex fluid within the pelvis. IMPRESSION: 1. Single intrauterine gestational sac with an estimated gestational age of [redacted] weeks, 1 day. No fetal pole identified at this time. Clinical correlation and follow-up with ultrasound in 7-11 days, or earlier if clinically indicated, recommended. 2. Large right ovarian hemorrhagic cyst. 3. Moderate free fluid in the pelvis. Electronically Signed   By: Elgie Collard M.D.   On: 07/21/2020 00:01     *Consult with Dr. Donavan Foil @ 0021 - notified of patient's complaints, assessments, lab & U/S results, recommended tx plan repeat HCG in 48 hours, give ectopic pregnancy precautions - ok to d/c home, agrees with plan   Assessment and Plan  Abdominal pain in pregnancy, first trimester  - Information  provided on abdominal pain in pregnancy and ovarian cyst - Discussed that her pain is more than likely from the hemorrhagic ovarian cyst seen on U/S - Advised to take Tylenol 1000 mg every 6 hours prn pain or Ibuprofen 600 mg every 6 hours prn pain for ONLY 2 days   - Discharge patient - Patient to return to MAU (d/t work schedule) on Thursday 07/23/2020 when she gets off work after 4 pm. - Patient verbalized an understanding of the plan of care and agrees.     Raelyn Mora, MSN, CNM 07/20/2020, 11:03 PM

## 2020-07-20 NOTE — ED Triage Notes (Signed)
She is about [redacted] weeks pregnant. C.o abdominal pain. No bleeding. Pain free on arrival.

## 2020-07-20 NOTE — ED Provider Notes (Signed)
[facility] EMERGENCY DEPARTMENT Provider Note   CSN: [id number] Arrival date & time: 07/20/20  1734     History Chief Complaint  Patient presents with  . Routine Prenatal Visit    Michelle Carr is a 19 y.o. female  G1P0  with past medical history significant for obesity and dysmenorrhea. LMP 05/29/2020  HPI Presents to emergency department today with chief complaint of sudden onset of abdominal pain earlier today.  Pain is located bilateral lower quadrants.  She states the pain is sharp.  It is intermittent.  She rates the pain 6 at 10 in severity.  She has not taken any medication for symptoms prior to arrival. States she had a home positive pregnancy test x4 weeks ago.  She went to [facility] where she had a blood test to confirm pregnancy.  She has not yet had an ultrasound to confirm IUP.  She denies any fever, chills, nausea, emesis, vaginal bleeding, vaginal discharge, urinary symptoms, diarrhea.    History reviewed. No pertinent past medical history.  Patient Active Problem List   Diagnosis Date Noted  . Family history of diabetes mellitus 05/01/2019  . Obesity 05/01/2019  . Dysmenorrhea 01/04/2018    History reviewed. No pertinent surgical history.   OB History    Gravida  1   Para      Term      Preterm      AB      Living        SAB      TAB      Ectopic      Multiple      Live Births              Family History  Problem Relation Age of Onset  . Hyperlipidemia Mother   . Hyperlipidemia Maternal Grandmother   . Diabetes Maternal Grandmother   . Hypertension Maternal Grandmother     Social History   Tobacco Use  . Smoking status: Never Smoker  . Smokeless tobacco: Never Used  Vaping Use  . Vaping Use: Never used  Substance Use Topics  . Alcohol use: No  . Drug use: No    Home Medications Prior to Admission medications   Medication Sig Start Date End Date Taking? Authorizing Provider  acetaminophen (TYLENOL)  500 MG tablet Take 1 tablet (500 mg total) by mouth every 6 (six) hours as needed. 06/06/20   Michela Pitcher A, PA-C  CRYSELLE-28 0.3-30 MG-MCG tablet Take 1 tablet by mouth daily 06/17/20   Salley Scarlet, MD  famotidine (PEPCID) 20 MG tablet Take 1 tablet (20 mg total) by mouth 2 (two) times daily. 06/06/20   Fawze, Mina A, PA-C  loperamide (IMODIUM) 2 MG capsule Take 1 capsule (2 mg total) by mouth 4 (four) times daily as needed for diarrhea or loose stools. 06/06/20   Fawze, Mina A, PA-C  metroNIDAZOLE (FLAGYL) 500 MG tablet Take 1 tablet (500 mg total) by mouth 2 (two) times daily. 02/05/20   Salley Scarlet, MD    Allergies    Shellfish allergy  Review of Systems   Review of Systems All other systems are reviewed and are negative for acute change except as noted in the HPI.  Physical Exam Updated Vital Signs BP 119/63 (BP Location: Right Arm)   Pulse 86   Temp 98.7 F (37.1 C)   Resp 17   Ht 5\' 5"  (1.651 m)   Wt 95.3 kg   LMP 05/29/2020   SpO2  100%   BMI 34.95 kg/m   Physical Exam Vitals and nursing note reviewed.  Constitutional:      General: She is not in acute distress.    Appearance: She is not ill-appearing.  HENT:     Head: Normocephalic and atraumatic.     Right Ear: Tympanic membrane and external ear normal.     Left Ear: Tympanic membrane and external ear normal.     Nose: Nose normal.     Mouth/Throat:     Mouth: Mucous membranes are moist.     Pharynx: Oropharynx is clear.  Eyes:     General: No scleral icterus.       Right eye: No discharge.        Left eye: No discharge.     Extraocular Movements: Extraocular movements intact.     Conjunctiva/sclera: Conjunctivae normal.     Pupils: Pupils are equal, round, and reactive to light.  Neck:     Vascular: No JVD.  Cardiovascular:     Rate and Rhythm: Normal rate and regular rhythm.     Pulses: Normal pulses.          Radial pulses are 2+ on the right side and 2+ on the left side.     Heart sounds:  Normal heart sounds.  Pulmonary:     Comments: Lungs clear to auscultation in all fields. Symmetric chest rise. No wheezing, rales, or rhonchi. Abdominal:     Comments: Abdomen is soft, non-distended, mild tenderness to palpation of bilateral right and left lower quadrants. No rigidity, no guarding. No peritoneal signs.  Musculoskeletal:        General: Normal range of motion.     Cervical back: Normal range of motion.  Skin:    General: Skin is warm and dry.     Capillary Refill: Capillary refill takes less than 2 seconds.  Neurological:     Mental Status: She is oriented to person, place, and time.     GCS: GCS eye subscore is 4. GCS verbal subscore is 5. GCS motor subscore is 6.     Comments: Fluent speech, no facial droop.  Psychiatric:        Behavior: Behavior normal.     ED Results / Procedures / Treatments   Labs (all labs ordered are listed, but only abnormal results are displayed) Labs Reviewed  PREGNANCY, URINE - Abnormal; Notable for the following components:      Result Value   Preg Test, Ur POSITIVE (*)    All other components within normal limits  URINALYSIS, ROUTINE W REFLEX MICROSCOPIC  CBC WITH DIFFERENTIAL/PLATELET  COMPREHENSIVE METABOLIC PANEL  HCG, QUANTITATIVE, PREGNANCY  ABO/RH    EKG None  Radiology No results found.  Procedures Procedures (including critical care time)  Medications Ordered in ED Medications - No data to display  ED Course  I have reviewed the triage vital signs and the nursing notes.  Pertinent labs & imaging results that were available during my care of the patient were reviewed by me and considered in my medical decision making (see chart for details).    MDM Rules/Calculators/A&P                          19 year old female with lower abdominal pain.  She had a home positive pregnancy test x4 weeks ago. She is afebrile, hemodynamically stable, no tachycardia or hypoxia.  On exam she has very mild tenderness palpation  of bilateral lower  quadrants. UA is without infection.  Pregnancy test is positive.  Attempted to initiate work-up with CBC, CMP, hCG quant and ABO/Rh.  CBC and CMP were able to be collected and are in process. Unfortunately at this time we do not have ultrasound at this facility.   Discussed case with MAU APP Joni Reining who accepts patient to come via POV as patient needs [place] to r/o ectopic.  Patient is stable at time of disposition. Findings and plan of care discussed with supervising physician Dr. Stevie Kern who agrees with plan of care.  Portions of this note were generated with Scientist, clinical (histocompatibility and immunogenetics). Dictation errors may occur despite best attempts at proofreading.  Final Clinical Impression(s) / ED Diagnoses Final diagnoses:  Abdominal pain in pregnancy, first trimester    Rx / DC Orders ED Discharge Orders    None       Kathyrn Lass 07/20/20 2105    Milagros Loll, MD 07/21/20 1616

## 2020-07-20 NOTE — MAU Note (Signed)
Pt reports to MAU c/o lower abdominal pain that is a 4/10 and it is sharp. Pt denies bleeding or LOF. No other complaints.

## 2020-07-20 NOTE — Discharge Instructions (Addendum)
Return to MAU:  If you have heavy bleeding that soaks through more that 2 pads per hour for an hour or more  If you bleed so much that you feel like you might pass out or you do pass out  If you have significant abdominal pain that is not improved with Tylenol 1000 mg every 6 hours as needed for pain  If you develop a fever > 100.5   

## 2020-07-21 LAB — ABO/RH: ABO/RH(D): A POS

## 2020-07-21 LAB — GC/CHLAMYDIA PROBE AMP (~~LOC~~) NOT AT ARMC
Chlamydia: NEGATIVE
Comment: NEGATIVE
Comment: NORMAL
Neisseria Gonorrhea: NEGATIVE

## 2020-07-21 LAB — HIV ANTIBODY (ROUTINE TESTING W REFLEX): HIV Screen 4th Generation wRfx: NONREACTIVE

## 2020-07-23 ENCOUNTER — Inpatient Hospital Stay (HOSPITAL_COMMUNITY)
Admission: AD | Admit: 2020-07-23 | Discharge: 2020-07-23 | Disposition: A | Discharge status: Home / Self Care | Payer: Commercial Managed Care - PPO | Attending: Obstetrics and Gynecology | Admitting: Obstetrics and Gynecology

## 2020-07-23 ENCOUNTER — Other Ambulatory Visit: Payer: Self-pay

## 2020-07-23 DIAGNOSIS — Z3A01 Less than 8 weeks gestation of pregnancy: Secondary | ICD-10-CM | POA: Diagnosis not present

## 2020-07-23 DIAGNOSIS — O3680X0 Pregnancy with inconclusive fetal viability, not applicable or unspecified: Secondary | ICD-10-CM | POA: Diagnosis not present

## 2020-07-23 LAB — HCG, QUANTITATIVE, PREGNANCY: hCG, Beta Chain, Quant, S: 6596 m[IU]/mL — ABNORMAL HIGH (ref <5)

## 2020-07-23 NOTE — Discharge Instructions (Signed)
First Trimester of Pregnancy  The first trimester of pregnancy is from week 1 until the end of week 13 (months 1 through 3). During this time, your baby will begin to develop inside you. At 6-8 weeks, the eyes and face are formed, and the heartbeat can be seen on ultrasound. At the end of 12 weeks, all the baby's organs are formed. Prenatal care is all the medical care you receive before the birth of your baby. Make sure you get good prenatal care and follow all of your doctor's instructions. Follow these instructions at home: Medicines  Take over-the-counter and prescription medicines only as told by your doctor. Some medicines are safe and some medicines are not safe during pregnancy.  Take a prenatal vitamin that contains at least 600 micrograms (mcg) of folic acid.  If you have trouble pooping (constipation), take medicine that will make your stool soft (stool softener) if your doctor approves. Eating and drinking   Eat regular, healthy meals.  Your doctor will tell you the amount of weight gain that is right for you.  Avoid raw meat and uncooked cheese.  If you feel sick to your stomach (nauseous) or throw up (vomit): ? Eat 4 or 5 small meals a day instead of 3 large meals. ? Try eating a few soda crackers. ? Drink liquids between meals instead of during meals.  To prevent constipation: ? Eat foods that are high in fiber, like fresh fruits and vegetables, whole grains, and beans. ? Drink enough fluids to keep your pee (urine) clear or pale yellow. Activity  Exercise only as told by your doctor. Stop exercising if you have cramps or pain in your lower belly (abdomen) or low back.  Do not exercise if it is too hot, too humid, or if you are in a place of great height (high altitude).  Try to avoid standing for long periods of time. Move your legs often if you must stand in one place for a long time.  Avoid heavy lifting.  Wear low-heeled shoes. Sit and stand up  straight.  You can have sex unless your doctor tells you not to. Relieving pain and discomfort  Wear a good support bra if your breasts are sore.  Take warm water baths (sitz baths) to soothe pain or discomfort caused by hemorrhoids. Use hemorrhoid cream if your doctor says it is okay.  Rest with your legs raised if you have leg cramps or low back pain.  If you have puffy, bulging veins (varicose veins) in your legs: ? Wear support hose or compression stockings as told by your doctor. ? Raise (elevate) your feet for 15 minutes, 3-4 times a day. ? Limit salt in your food. Prenatal care  Schedule your prenatal visits by the twelfth week of pregnancy.  Write down your questions. Take them to your prenatal visits.  Keep all your prenatal visits as told by your doctor. This is important. Safety  Wear your seat belt at all times when driving.  Make a list of emergency phone numbers. The list should include numbers for family, friends, the hospital, and police and fire departments. General instructions  Ask your doctor for a referral to a local prenatal class. Begin classes no later than at the start of month 6 of your pregnancy.  Ask for help if you need counseling or if you need help with nutrition. Your doctor can give you advice or tell you where to go for help.  Do not use hot tubs, steam   rooms, or saunas.  Do not douche or use tampons or scented sanitary pads.  Do not cross your legs for long periods of time.  Avoid all herbs and alcohol. Avoid drugs that are not approved by your doctor.  Do not use any tobacco products, including cigarettes, chewing tobacco, and electronic cigarettes. If you need help quitting, ask your doctor. You may get counseling or other support to help you quit.  Avoid cat litter boxes and soil used by cats. These carry germs that can cause birth defects in the baby and can cause a loss of your baby (miscarriage) or stillbirth.  Visit your dentist.  At home, brush your teeth with a soft toothbrush. Be gentle when you floss. Contact a doctor if:  You are dizzy.  You have mild cramps or pressure in your lower belly.  You have a nagging pain in your belly area.  You continue to feel sick to your stomach, you throw up, or you have watery poop (diarrhea).  You have a bad smelling fluid coming from your vagina.  You have pain when you pee (urinate).  You have increased puffiness (swelling) in your face, hands, legs, or ankles. Get help right away if:  You have a fever.  You are leaking fluid from your vagina.  You have spotting or bleeding from your vagina.  You have very bad belly cramping or pain.  You gain or lose weight rapidly.  You throw up blood. It may look like coffee grounds.  You are around people who have German measles, fifth disease, or chickenpox.  You have a very bad headache.  You have shortness of breath.  You have any kind of trauma, such as from a fall or a car accident. Summary  The first trimester of pregnancy is from week 1 until the end of week 13 (months 1 through 3).  To take care of yourself and your unborn baby, you will need to eat healthy meals, take medicines only if your doctor tells you to do so, and do activities that are safe for you and your baby.  Keep all follow-up visits as told by your doctor. This is important as your doctor will have to ensure that your baby is healthy and growing well. This information is not intended to replace advice given to you by your health care provider. Make sure you discuss any questions you have with your health care provider. Document Revised: 03/07/2019 Document Reviewed: 11/22/2016 Elsevier Patient Education  2020 Elsevier Inc.  

## 2020-07-23 NOTE — MAU Provider Note (Signed)
Ms. Michelle Carr  is a 19 y.o. G1P0 at [redacted]w[redacted]d who presents to MAU today for follow-up quant hCG after 48 hours. She denies vaginal bleeding, abdominal pain or fever.   BP 127/82   Pulse 85   Temp 98 F (36.7 C)   Resp 18   LMP 05/29/2020   SpO2 100%   CONSTITUTIONAL: Well-developed, well-nourished female in no acute distress.  MUSCULOSKELETAL: Normal range of motion.  CARDIOVASCULAR: Regular heart rate RESPIRATORY: Normal effort NEUROLOGICAL: Alert and oriented to person, place, and time.  GI: Soft, non-tender SKIN: Skin is warm and dry. No rash noted. Not diaphoretic. No erythema. No pallor. PSYCH: Normal mood and affect. Normal behavior. Normal judgment and thought content.  Results for [name], BILLS (MRN [id number]) as of 07/23/2020 19:27  Ref. Range 07/20/2020 20:43 07/20/2020 21:49 07/20/2020 22:14 07/20/2020 23:27 07/23/2020 17:02  HCG, Beta Chain, Quant, S Latest Ref Range: <5 mIU/mL 2,211 (H)    6,596 (H)   A: Appropriate rise in quant hCG after 48 hours  P: Discharge home First trimester/ectopic precautions discussed Patient will return for follow-up US in 1 week. Order placed. They will call the patient with an appointment time Patient may return to MAU as needed or if her condition were to change or worsen   Michelle Carr 07/23/2020 7:26 PM

## 2020-07-23 NOTE — MAU Note (Signed)
.   Michelle Carr is a 19 y.o. at [redacted]w[redacted]d here in MAU reporting: she was told to come in for repeat lab work today. Pt denies any VB or Pain. LMP: 05/29/20  Pain score: 0 Vitals:   07/23/20 1648  BP: 127/82  Pulse: 85  Resp: 18  Temp: 98 F (36.7 C)  SpO2: 100%     FHT: Lab orders placed from triage: QUANT

## 2020-07-29 ENCOUNTER — Other Ambulatory Visit: Payer: Self-pay

## 2020-07-29 ENCOUNTER — Ambulatory Visit
Admission: RE | Admit: 2020-07-29 | Discharge: 2020-07-29 | Disposition: A | Discharge status: Home / Self Care | Payer: Medicaid Other | Source: Ambulatory Visit | Attending: Medical | Admitting: Medical

## 2020-07-29 DIAGNOSIS — O3680X0 Pregnancy with inconclusive fetal viability, not applicable or unspecified: Secondary | ICD-10-CM | POA: Diagnosis not present

## 2020-08-20 ENCOUNTER — Other Ambulatory Visit (HOSPITAL_COMMUNITY)
Admission: RE | Admit: 2020-08-20 | Discharge: 2020-08-20 | Disposition: A | Discharge status: Home / Self Care | Payer: Medicaid Other | Source: Ambulatory Visit | Attending: Nurse Practitioner | Admitting: Nurse Practitioner

## 2020-08-20 ENCOUNTER — Other Ambulatory Visit: Payer: Self-pay

## 2020-08-20 ENCOUNTER — Ambulatory Visit (INDEPENDENT_AMBULATORY_CARE_PROVIDER_SITE_OTHER): Payer: Medicaid Other | Admitting: Nurse Practitioner

## 2020-08-20 ENCOUNTER — Encounter: Payer: Self-pay | Admitting: Nurse Practitioner

## 2020-08-20 VITALS — BP 108/65 | HR 84 | Wt 226.0 lb

## 2020-08-20 DIAGNOSIS — B9689 Other specified bacterial agents as the cause of diseases classified elsewhere: Secondary | ICD-10-CM

## 2020-08-20 DIAGNOSIS — Z6834 Body mass index (BMI) 34.0-34.9, adult: Secondary | ICD-10-CM | POA: Diagnosis not present

## 2020-08-20 DIAGNOSIS — Z3A09 9 weeks gestation of pregnancy: Secondary | ICD-10-CM | POA: Diagnosis not present

## 2020-08-20 DIAGNOSIS — Z3401 Encounter for supervision of normal first pregnancy, first trimester: Secondary | ICD-10-CM

## 2020-08-20 DIAGNOSIS — O210 Mild hyperemesis gravidarum: Secondary | ICD-10-CM

## 2020-08-20 DIAGNOSIS — Z3400 Encounter for supervision of normal first pregnancy, unspecified trimester: Secondary | ICD-10-CM | POA: Diagnosis present

## 2020-08-20 DIAGNOSIS — N760 Acute vaginitis: Secondary | ICD-10-CM

## 2020-08-20 MED ORDER — DOXYLAMINE-PYRIDOXINE 10-10 MG PO TBEC: DELAYED_RELEASE_TABLET | ORAL | 2 refills | Status: DC

## 2020-08-20 MED ORDER — BLOOD PRESSURE KIT DEVI: 1.0000 | 0 refills | Status: DC

## 2020-08-20 NOTE — Progress Notes (Signed)
NOB.  Declined FLU Vaccine.  C/o NV every day.  PHQ-9=1

## 2020-08-20 NOTE — Progress Notes (Signed)
Subjective:   Michelle Carr is a 19 y.o. G1P0 at [redacted]w[redacted]d by early ultrasound being seen today for her first obstetrical visit.  Her obstetrical history is significant for obesity and morning sickness. Patient does intend to breast feed. Pregnancy history fully reviewed.  Patient reports vomiting.  HISTORY: OB History  Gravida Para Term Preterm AB Living  1 0 0 0 0 0  SAB TAB Ectopic Multiple Live Births  0 0 0 0 0    # Outcome Date GA Lbr Len/2nd Weight Sex Delivery Anes PTL Lv  1 Current            Past Medical History:  Diagnosis Date  . Medical history non-contributory    Past Surgical History:  Procedure Laterality Date  . NO PAST SURGERIES     Family History  Problem Relation Age of Onset  . Hyperlipidemia Mother   . Hyperlipidemia Maternal Grandmother   . Diabetes Maternal Grandmother   . Hypertension Maternal Grandmother    Social History   Tobacco Use  . Smoking status: Never Smoker  . Smokeless tobacco: Never Used  Vaping Use  . Vaping Use: Never used  Substance Use Topics  . Alcohol use: No  . Drug use: No   Allergies  Allergen Reactions  . Shellfish Allergy    Current Outpatient Medications on File Prior to Visit  Medication Sig Dispense Refill  . prenatal vitamin w/FE, FA (PRENATAL 1 + 1) 27-1 MG TABS tablet Take 1 tablet by mouth daily at 12 noon.     No current facility-administered medications on file prior to visit.     Exam   Vitals:   08/20/20 1318  BP: 108/65  Pulse: 84  Weight: 226 lb (102.5 kg)      Uterus:     Pelvic Exam: Perineum: no hemorrhoids, normal perineum   Vulva: normal external genitalia, no lesions   Vagina:  normal mucosa, normal discharge   Cervix: no lesions and normal,   Adnexa: normal adnexa and no mass, fullness, tenderness   Bony Pelvis: average  System: General: well-developed, well-nourished female in no acute distress   Breast:  normal appearance, no masses or tenderness   Skin: normal coloration  and turgor, no rashes   Neurologic: oriented, normal, negative, normal mood   Extremities: normal strength, tone, and muscle mass, ROM of all joints is normal   HEENT extraocular movement intact and sclera clear, anicteric   Mouth/Teeth deferred   Neck supple and no masses, normal thyroid   Cardiovascular: regular rate and rhythm   Respiratory:  no respiratory distress, normal breath sounds   Abdomen: soft, non-tender; no masses,  no organomegaly     Assessment:   Pregnancy: G1P0 Patient Active Problem List   Diagnosis Date Noted  . Supervision of normal first pregnancy 08/20/2020  . Morning sickness 08/20/2020  . Family history of diabetes mellitus 05/01/2019  . Obesity 05/01/2019  . Dysmenorrhea 01/04/2018     Plan:  1. Supervision of normal first pregnancy, antepartum Too early for FHT today, but EDC and viable pregnancy confirmed by previous US  - Blood Pressure Monitoring (BLOOD PRESSURE KIT) DEVI; 1 kit by Does not apply route once a week. Check Blood Pressure regularly and record readings into the Babyscripts App.  Large Cuff.  DX O90.0  Dispense: 1 each; Refill: 0 - Enroll Patient in Babyscripts - Babyscripts Schedule Optimization - Culture, OB Urine - CBC/D/Plt+RPR+Rh+ABO+Rub Ab... - Cervicovaginal ancillary only( [facility]) - Flu Vaccine  QUAD 36+ mos IM (Fluarix, Quad PF) - [place] MFM OB COMP + 14 WK; Future  2. BMI 34.0-34.9,adult Advised weight gain of 20 pounds this pregnancy  3. Morning sickness Prescribed diclegis and reviewed how to take the medication.    Initial labs drawn. Continue prenatal vitamins. Genetic Screening discussed, but is too ealy in pregnancy - will draw at next visit: will order at next visit. Ultrasound discussed; fetal anatomic survey: ordered. Problem list reviewed and updated. The nature of [facility] with multiple MDs and other Advanced Practice Providers was explained to patient; also  emphasized that residents, students are part of our team. Routine obstetric precautions reviewed. Return in about 4 weeks (around 09/17/2020) for in person ROB and labs.  Total face-to-face time with patient: 40 minutes.  Over 50% of encounter was spent on counseling and coordination of care.     Earlie Server, FNP Family Nurse Practitioner, [facility] for [employer], [place] [facility] 08/20/2020 2:01 PM

## 2020-08-21 LAB — CBC/D/PLT+RPR+RH+ABO+RUB AB...
Antibody Screen: NEGATIVE
Basophils Absolute: 0 10*3/uL (ref 0.0–0.2)
Basos: 0 %
EOS (ABSOLUTE): 0.1 10*3/uL (ref 0.0–0.4)
Eos: 2 %
HCV Ab: <0.1 s/co ratio (ref 0.0–0.9)
HIV Screen 4th Generation wRfx: NONREACTIVE
Hematocrit: 36.9 % (ref 34.0–46.6)
Hemoglobin: 12.3 g/dL (ref 11.1–15.9)
Hepatitis B Surface Ag: NEGATIVE
Immature Grans (Abs): 0 10*3/uL (ref 0.0–0.1)
Immature Granulocytes: 0 %
Lymphocytes Absolute: 2 10*3/uL (ref 0.7–3.1)
Lymphs: 22 %
MCH: 27.7 pg (ref 26.6–33.0)
MCHC: 33.3 g/dL (ref 31.5–35.7)
MCV: 83 fL (ref 79–97)
Monocytes Absolute: 0.5 10*3/uL (ref 0.1–0.9)
Monocytes: 6 %
Neutrophils Absolute: 6.1 10*3/uL (ref 1.4–7.0)
Neutrophils: 70 %
Platelets: 270 10*3/uL (ref 150–450)
RBC: 4.44 x10E6/uL (ref 3.77–5.28)
RDW: 12.2 % (ref 11.7–15.4)
RPR Ser Ql: NONREACTIVE
Rh Factor: POSITIVE
Rubella Antibodies, IGG: 4.33 index (ref >0.99)
WBC: 8.8 10*3/uL (ref 3.4–10.8)

## 2020-08-21 LAB — CERVICOVAGINAL ANCILLARY ONLY
Bacterial Vaginitis (gardnerella): POSITIVE — AB
Candida Glabrata: NEGATIVE
Candida Vaginitis: NEGATIVE
Chlamydia: NEGATIVE
Comment: NEGATIVE
Comment: NEGATIVE
Comment: NEGATIVE
Comment: NEGATIVE
Comment: NEGATIVE
Comment: NORMAL
Neisseria Gonorrhea: NEGATIVE
Trichomonas: NEGATIVE

## 2020-08-21 LAB — HCV INTERPRETATION

## 2020-08-22 LAB — URINE CULTURE, OB REFLEX

## 2020-08-22 LAB — CULTURE, OB URINE

## 2020-08-25 MED ORDER — METRONIDAZOLE 0.75 % VA GEL: 1.0000 | Freq: Every day | VAGINAL | 0 refills | Status: AC

## 2020-08-25 NOTE — Addendum Note (Signed)
Addended by: Currie Paris on: 08/25/2020 08:26 AM   Modules accepted: Orders

## 2020-09-08 ENCOUNTER — Other Ambulatory Visit: Payer: Self-pay

## 2020-09-08 ENCOUNTER — Inpatient Hospital Stay (HOSPITAL_COMMUNITY)
Admission: AD | Admit: 2020-09-08 | Discharge: 2020-09-08 | Disposition: A | Discharge status: Home / Self Care | Payer: Medicaid Other | Attending: Obstetrics and Gynecology | Admitting: Obstetrics and Gynecology

## 2020-09-08 DIAGNOSIS — J069 Acute upper respiratory infection, unspecified: Secondary | ICD-10-CM

## 2020-09-08 DIAGNOSIS — R519 Headache, unspecified: Secondary | ICD-10-CM | POA: Diagnosis not present

## 2020-09-08 DIAGNOSIS — J029 Acute pharyngitis, unspecified: Secondary | ICD-10-CM | POA: Insufficient documentation

## 2020-09-08 DIAGNOSIS — Z3A11 11 weeks gestation of pregnancy: Secondary | ICD-10-CM | POA: Diagnosis not present

## 2020-09-08 DIAGNOSIS — Z20822 Contact with and (suspected) exposure to covid-19: Secondary | ICD-10-CM | POA: Insufficient documentation

## 2020-09-08 DIAGNOSIS — R059 Cough, unspecified: Secondary | ICD-10-CM | POA: Diagnosis not present

## 2020-09-08 DIAGNOSIS — Z3A12 12 weeks gestation of pregnancy: Secondary | ICD-10-CM | POA: Insufficient documentation

## 2020-09-08 DIAGNOSIS — Z3401 Encounter for supervision of normal first pregnancy, first trimester: Secondary | ICD-10-CM

## 2020-09-08 DIAGNOSIS — O26891 Other specified pregnancy related conditions, first trimester: Secondary | ICD-10-CM | POA: Insufficient documentation

## 2020-09-08 DIAGNOSIS — O99511 Diseases of the respiratory system complicating pregnancy, first trimester: Secondary | ICD-10-CM

## 2020-09-08 LAB — RESPIRATORY PANEL BY RT PCR (FLU A&B, COVID)
Influenza A by PCR: NEGATIVE
Influenza B by PCR: NEGATIVE
SARS Coronavirus 2 by RT PCR: NEGATIVE

## 2020-09-08 NOTE — Discharge Instructions (Signed)
Safe Medications in Pregnancy    Acne: Benzoyl Peroxide Salicylic Acid  Backache/Headache: Tylenol: 2 regular strength every 4 hours OR              2 Extra strength every 6 hours  Colds/Coughs/Allergies: Benadryl (alcohol free) 25 mg every 6 hours as needed Breath right strips Claritin Cepacol throat lozenges Chloraseptic throat spray Cold-Eeze- up to three times per day Cough drops, alcohol free Flonase (by prescription only) Guaifenesin Mucinex Robitussin DM (plain only, alcohol free) Saline nasal spray/drops Sudafed (pseudoephedrine) & Actifed ** use only after [redacted] weeks gestation and if you do not have high blood pressure Tylenol Vicks Vaporub Zinc lozenges Zyrtec   Constipation: Colace Ducolax suppositories Fleet enema Glycerin suppositories Metamucil Milk of magnesia Miralax Senokot Smooth move tea  Diarrhea: Kaopectate Imodium A-D  *NO pepto Bismol  Hemorrhoids: Anusol Anusol HC Preparation H Tucks  Indigestion: Tums Maalox Mylanta Zantac  Pepcid  Insomnia: Benadryl (alcohol free) 25mg  every 6 hours as needed Tylenol PM Unisom, no Gelcaps  Leg Cramps: Tums MagGel  Nausea/Vomiting:  Bonine Dramamine Emetrol Ginger extract Sea bands Meclizine  Nausea medication to take during pregnancy:  Unisom (doxylamine succinate 25 mg tablets) Take one tablet daily at bedtime. If symptoms are not adequately controlled, the dose can be increased to a maximum recommended dose of two tablets daily (1/2 tablet in the morning, 1/2 tablet mid-afternoon and one at bedtime). Vitamin B6 100mg  tablets. Take one tablet twice a day (up to 200 mg per day).  Skin Rashes: Aveeno products Benadryl cream or 25mg  every 6 hours as needed Calamine Lotion 1% cortisone cream  Yeast infection: Gyne-lotrimin 7 Monistat 7   **If taking multiple medications, please check labels to avoid duplicating the same active ingredients **take  medication as directed on the label ** Do not exceed 4000 mg of tylenol in 24 hours **Do not take medications that contain aspirin or ibuprofen         Pregnancy and COVID-19 Coronavirus disease, also called COVID-19, is an infection of the lungs and airways (respiratory tract). It is unclear at this time if pregnancy makes it more likely for you to get COVID-19, or what effects the infection may have on your unborn baby. However, pregnancy causes changes to your heart, lungs, and your body's disease-fighting system (immune system). Some of these changes make it more likely for you to get sick and have more serious illness. Therefore, it is important for you to take precautions in order to protect yourself and your unborn baby. There have been studies showing that obesity and diabetes may put you at higher risk for serious illness. If you are pregnant and are obese or have diabetes, you should take extra precautions to protect yourself from the virus. Work with your health care team to develop a plan to protect yourself from all infections, including COVID-19. This is one way for you to stay healthy during your pregnancy and to keep your baby healthy as well. How does this affect me? If you get COVID-19, there is a risk that you may:  Get a respiratory illness that can lead to pneumonia.  Give birth to your baby before 37 weeks of pregnancy (premature birth). If you have or may have COVID-19, your health care provider may recommend special precautions around your pregnancy. This may affect how you:  Receive care before delivery (prenatal care). How you visit your health care provider may change. Tests and scans may need to be performed differently.  Receive  care during labor and delivery. This may affect your birth plan, including who may be with you during labor and delivery.  Receive care after you deliver your baby (postpartum care). You may stay longer in the hospital and in a special  room.  Feed your baby after he or she is born. Pregnancy can be an especially stressful time because of the changes in your body and the preparation involved in becoming a parent. In addition, you may be feeling especially fearful, anxious, or stressed because of COVID-19 and how it is affecting you. How does this affect my baby? It is not known whether a mother will transmit the virus to her unborn baby. There is a risk that if you get COVID-19:  The virus that causes COVID-19 can pass to your baby.  You may have premature birth. Your baby may require more medical care if this happens. What can I do to lower my risk?  There is no vaccine to help prevent COVID-19. However, there are actions that you can take to protect yourself and others from this virus. Cleaning and personal hygiene  Wash your hands often with soap and water for at least 20 seconds. If soap and water are not available, use alcohol-based hand sanitizer.  Avoid touching your mouth, face, eyes, or nose.  Clean and disinfect objects and surfaces that are frequently touched every day. These may include: ? Counters and tables. ? Doorknobs and light switches. ? Sinks and faucets. ? Electronics such as phones, remote controls, keyboards, computers, and tablets. Stay away from others  Stay away from people who are sick, if possible.  Avoid social gatherings and travel.  Stay home as much as possible. Follow these instructions: Breastfeeding It is not known if the virus that causes COVID-19 can pass through breast milk to your baby. You should make a plan for feeding your infant with your family and your health care team. If you have or may have COVID-19, your health care provider may recommend that you take precautions while breastfeeding, such as:  Washing your hands before feeding your baby.  Wearing a mask while feeding your baby.  Pumping or expressing breast milk to feed to your baby. If possible, ask someone in  your household who is not sick to feed your baby the expressed breast milk. ? Wash your hands before touching pump parts. ? Wash and disinfect all pump parts after expressing milk. Follow the manufacturer's instructions to clean and disinfect all pump parts. General instructions  If you think you have a COVID-19 infection, contact your health care provider right away. Tell your health care provider that you think you may have a COVID-19 infection.  Follow your health care provider's instructions on taking medicines. Some medicines may be unsafe to take during pregnancy.  Cover your mouth and nose by wearing a mask or other cloth covering over your face when you go out in public.  Find ways to manage stress. These may include: ? Using relaxation techniques like meditation and deep breathing. ? Getting regular exercise. Most women can continue their usual exercise routine during pregnancy. Ask your health care provider what activities are safe for you. ? Seeking support from family, friends, or spiritual resources. If you cannot be together in person, you can still connect by phone calls, texts, video calls, or online messaging. ? Spending time doing relaxing activities that you enjoy, like listening to music or reading a good book.  Ask for help if you have counseling or  nutritional needs during pregnancy. Your health care provider can offer advice or refer you to resources or specialists who can help you with various needs.  Keep all follow-up visits as told by your health care provider. This is important. Where to find more information Centers for Disease Control and Prevention (CDC): AffordableShare.com.br World Health Organization [facility]): PokerPortraits.es [employer] of Obstetricians and Gynecologists (ACOG): BuyDucts.dk Questions  to ask your health care team  What should I do if I have COVID-19 symptoms?  How will COVID-19 affect my prenatal care visits, tests and scans, labor and delivery, and postpartum care?  Should I plan to breastfeed my baby?  Where can I find mental health resources?  Where can I find support if I have financial concerns? Contact a health care provider if:  You have signs and symptoms of infection, including a fever or cough. Tell your health care team that you think you may have a COVID-19 infection.  You have strong emotions, such as sadness or anxiety.  You feel unsafe in your home and need help finding a safe place to live.  You have bloody or watery vaginal discharge or vaginal bleeding. Get help right away if:  You have signs or symptoms of labor before 37 weeks of pregnancy. These include: ? Contractions that are 5 minutes or less apart, or that increase in frequency, intensity, or length. ? Sudden, sharp pain in the abdomen or in the lower back. ? A gush or trickle of fluid from your vagina.  You have signs of more serious illness such as: ? You have difficulty breathing. ? You have chest pain. ? You have a fever greater than 102F (39C) or higher that does not go away. ? You cannot drink fluids without vomiting. ? You feel extremely weak or you faint. These symptoms may represent a serious problem that is an emergency. Do not wait to see if the symptoms will go away. Get medical help right away. Call your local emergency services (911 in the [place] Do not drive yourself to the hospital. Summary  Coronavirus disease, also called COVID-19, is an infection of the lungs and airways (respiratory tract). It is unclear at this time if pregnancy makes you more susceptible to COVID-19 and what effects it may have on unborn babies.  It is important to take precautions to protect yourself and your developing baby. This includes washing your hands often, avoiding touching your  mouth, face, eyes, or nose, avoiding social gatherings and travel, and staying away from people who are sick.  If you think you have a COVID-19 infection, contact your health care provider right away. Tell your health care provider that you think you may have a COVID-19 infection.  If you have or may have COVID-19, your health care provider may recommend special precautions during your pregnancy, labor and delivery, and after your baby is born. This information is not intended to replace advice given to you by your health care provider. Make sure you discuss any questions you have with your health care provider. Document Revised: 09/06/2019 Document Reviewed: 03/12/2019 Elsevier Patient Education  2020 [employer].

## 2020-09-08 NOTE — MAU Provider Note (Signed)
First Provider Initiated Contact with Patient 09/08/20 1521      S Ms. Michelle Carr is a 19 y.o. G1P0 @[redacted]w[redacted]d  patient who presents to MAU today with complaint of URI symptoms including sore throat, cough, stuffy nose, mild headache that started two weeks ago, without worsening or change in symptoms. Patient reports she has not been tested for COVID and reports she has been using Tylenol for her headache.  O BP 116/68 (BP Location: Right Arm)   Pulse (!) 108   Temp 98.3 F (36.8 C) (Oral)   Resp 20   Ht 5\' 4"  (1.626 m)   Wt 100.2 kg   LMP 05/29/2020   SpO2 100%   BMI 37.93 kg/m    Patient Vitals for the past 24 hrs:  BP Temp Temp src Pulse Resp SpO2 Height Weight  09/08/20 1453 116/68 98.3 F (36.8 C) Oral (!) 108 20 100 % -- --  09/08/20 1448 -- -- -- -- -- -- 5\' 4"  (1.626 m) 100.2 kg   Physical Exam Vitals and nursing note reviewed.  Constitutional:      General: She is not in acute distress.    Appearance: Normal appearance. She is not ill-appearing, toxic-appearing or diaphoretic.  HENT:     Head: Normocephalic and atraumatic.  Pulmonary:     Effort: Pulmonary effort is normal.  Neurological:     Mental Status: She is alert and oriented to person, place, and time.  Psychiatric:        Mood and Affect: Mood normal.        Behavior: Behavior normal.        Thought Content: Thought content normal.        Judgment: Judgment normal.    A Medical screening exam complete COVID swab performed  P Discharge from MAU in stable condition Discussed s/sx of COVID warranting ED/UC visit Safe meds in pregnancy list given Discussed symptomatic treatment at home and quarantine Warning signs for worsening condition that would warrant emergency follow-up discussed Patient may return to MAU as needed   Emily Forse, 11/08/20, NP 09/08/2020 3:26 PM

## 2020-09-08 NOTE — MAU Note (Signed)
Presents with c/o H/A, sore throat, and cough.  Reports has taken Tylenol for H/A 3 days ago, hasn't taken any meds for cough or sore throat.

## 2020-09-17 ENCOUNTER — Encounter: Payer: Self-pay | Admitting: Certified Nurse Midwife

## 2020-09-17 ENCOUNTER — Ambulatory Visit (INDEPENDENT_AMBULATORY_CARE_PROVIDER_SITE_OTHER): Payer: Medicaid Other | Admitting: Certified Nurse Midwife

## 2020-09-17 ENCOUNTER — Other Ambulatory Visit: Payer: Self-pay

## 2020-09-17 VITALS — BP 110/67 | HR 99 | Wt 221.0 lb

## 2020-09-17 DIAGNOSIS — Z3401 Encounter for supervision of normal first pregnancy, first trimester: Secondary | ICD-10-CM

## 2020-09-17 DIAGNOSIS — Z3A13 13 weeks gestation of pregnancy: Secondary | ICD-10-CM

## 2020-09-17 MED ORDER — BLOOD PRESSURE KIT DEVI: 1.0000 | 0 refills | Status: DC

## 2020-09-17 NOTE — Patient Instructions (Signed)

## 2020-09-17 NOTE — Progress Notes (Signed)
Pt is here for ROB, [redacted]w[redacted]d.

## 2020-09-17 NOTE — Progress Notes (Signed)
   PRENATAL VISIT NOTE  Subjective:  Michelle Carr is a 19 y.o. G1P0 at [redacted]w[redacted]d being seen today for ongoing prenatal care.  She is currently monitored for the following issues for this low-risk pregnancy and has Dysmenorrhea; Family history of diabetes mellitus; Obesity; Supervision of normal first pregnancy; and Morning sickness on their problem list.  Patient reports no complaints.  Contractions: Not present. Vag. Bleeding: None.   . Denies leaking of fluid.   The following portions of the patient's history were reviewed and updated as appropriate: allergies, current medications, past family history, past medical history, past social history, past surgical history and problem list.   Objective:   Vitals:   09/17/20 0852  BP: 110/67  Pulse: 99  Weight: 221 lb (100.2 kg)    Fetal Status: Fetal Heart Rate (bpm): 145         General:  Alert, oriented and cooperative. Patient is in no acute distress.  Skin: Skin is warm and dry. No rash noted.   Cardiovascular: Normal heart rate noted  Respiratory: Normal respiratory effort, no problems with respiration noted  Abdomen: Soft, gravid, appropriate for gestational age.  Pain/Pressure: Absent     Pelvic: Cervical exam deferred        Extremities: Normal range of motion.  Edema: None  Mental Status: Normal mood and affect. Normal behavior. Normal judgment and thought content.   Assessment and Plan:  Pregnancy: G1P0 at [redacted]w[redacted]d 1. Encounter for supervision of normal first pregnancy in first trimester - Patient doing well, no complaints  - routine prenatal care - anticipatory guidance on upcoming appointments - Discussed with patient changes to skin due to pregnancy, discussed with patient safe medications during pregnancy  - Genetic Screening  2. [redacted] weeks gestation of pregnancy - Blood Pressure Monitoring (BLOOD PRESSURE KIT) DEVI; 1 kit by Does not apply route once a week. Check Blood Pressure regularly and record readings into the  [employer] App.  Large Cuff.  DX O90.0  Dispense: 1 each; Refill: 0  Preterm labor symptoms and general obstetric precautions including but not limited to vaginal bleeding, contractions, leaking of fluid and fetal movement were reviewed in detail with the patient. Please refer to After Visit Summary for other counseling recommendations.   Return in about 4 weeks (around 10/15/2020) for LROB-mychart.  Future Appointments  Date Time Provider [place]  10/15/2020  9:15 AM Gavin Pound, CNM [facility] None  10/29/2020 10:45 AM [facility] US4 [facility] [place], CNM

## 2020-09-22 ENCOUNTER — Other Ambulatory Visit: Payer: Self-pay | Admitting: *Deleted

## 2020-09-22 MED ORDER — VITAFOL-NANO 18-0.6-0.4 MG PO TABS: 1.0000 | ORAL_TABLET | Freq: Every day | ORAL | 11 refills | Status: DC

## 2020-09-22 NOTE — Progress Notes (Signed)
PNV sent to pharmacy 

## 2020-09-23 ENCOUNTER — Encounter: Payer: Self-pay | Admitting: Certified Nurse Midwife

## 2020-09-28 ENCOUNTER — Encounter: Payer: Self-pay | Admitting: Certified Nurse Midwife

## 2020-10-14 ENCOUNTER — Other Ambulatory Visit: Payer: Self-pay

## 2020-10-14 ENCOUNTER — Inpatient Hospital Stay (HOSPITAL_COMMUNITY)
Admission: AD | Admit: 2020-10-14 | Discharge: 2020-10-14 | Disposition: A | Discharge status: Home / Self Care | Payer: Medicaid Other | Attending: Obstetrics and Gynecology | Admitting: Obstetrics and Gynecology

## 2020-10-14 DIAGNOSIS — S80212A Abrasion, left knee, initial encounter: Secondary | ICD-10-CM | POA: Insufficient documentation

## 2020-10-14 DIAGNOSIS — W19XXXA Unspecified fall, initial encounter: Secondary | ICD-10-CM | POA: Diagnosis not present

## 2020-10-14 DIAGNOSIS — R109 Unspecified abdominal pain: Secondary | ICD-10-CM

## 2020-10-14 DIAGNOSIS — O26892 Other specified pregnancy related conditions, second trimester: Secondary | ICD-10-CM | POA: Insufficient documentation

## 2020-10-14 DIAGNOSIS — S80211A Abrasion, right knee, initial encounter: Secondary | ICD-10-CM | POA: Diagnosis not present

## 2020-10-14 DIAGNOSIS — O99891 Other specified diseases and conditions complicating pregnancy: Secondary | ICD-10-CM

## 2020-10-14 DIAGNOSIS — Z3A16 16 weeks gestation of pregnancy: Secondary | ICD-10-CM

## 2020-10-14 DIAGNOSIS — Z9181 History of falling: Secondary | ICD-10-CM

## 2020-10-14 NOTE — Discharge Instructions (Signed)

## 2020-10-14 NOTE — MAU Note (Signed)
Philipp Deputy CNM will see pt in Family Rm to discuss POC.

## 2020-10-14 NOTE — MAU Note (Addendum)
About 1330 I tripped and fell forward and then rolled over to the side. Unsure if hit abdomen or not. Denies VB or LOF. Cramping started in lower abd about 1800 and lasted about 2hrs but stopped. Just came in to be sure everything is ok. No pain now

## 2020-10-14 NOTE — Progress Notes (Signed)
Philipp Deputy CNM discussed d/c plan with pt. Written and verbal d/c instructions given and understanding voiced

## 2020-10-14 NOTE — MAU Provider Note (Signed)
None     S Ms. Michelle Carr is a 19 y.o. G1P0 patient who presents to MAU today with complaint of s/p tripping over a step earlier in the day and landing on her knees. Felt fine initially, but then had mild abd cramping at 1800 that has resolved. Denies leaking or bldg.   O BP (!) 115/57 (BP Location: Right Arm)   Pulse 86   Temp 98.7 F (37.1 C)   Resp 18   Ht 5\' 4"  (1.626 m)   Wt 100.7 kg   LMP 05/29/2020   BMI 38.11 kg/m  Physical Exam  FHR dopplered at 147bpm Abrasions on knees  A Medical screening exam complete IUP@16 .6wks S/p fall- stable  P Discharge from MAU in stable condition Warning signs for worsening condition that would warrant emergency follow-up discussed Patient may return to MAU as needed (bldg, increased pain) Has OB appt tomorrow at [facility] (virtual) with anatomy u/s on 10/29/20 May use Tylenol as directed on bottle prn aches/mild cramping  14/2/21, CNM 10/14/2020 11:08 PM

## 2020-10-15 ENCOUNTER — Telehealth (INDEPENDENT_AMBULATORY_CARE_PROVIDER_SITE_OTHER): Payer: Medicaid Other

## 2020-10-15 VITALS — BP 128/63 | HR 81

## 2020-10-15 DIAGNOSIS — Z9181 History of falling: Secondary | ICD-10-CM

## 2020-10-15 DIAGNOSIS — Z3402 Encounter for supervision of normal first pregnancy, second trimester: Secondary | ICD-10-CM

## 2020-10-15 DIAGNOSIS — E669 Obesity, unspecified: Secondary | ICD-10-CM

## 2020-10-15 DIAGNOSIS — Z3A17 17 weeks gestation of pregnancy: Secondary | ICD-10-CM

## 2020-10-15 DIAGNOSIS — O99212 Obesity complicating pregnancy, second trimester: Secondary | ICD-10-CM

## 2020-10-15 MED ORDER — ASPIRIN EC 81 MG PO TBEC: 81.0000 mg | DELAYED_RELEASE_TABLET | Freq: Every day | ORAL | 2 refills | Status: DC

## 2020-10-15 NOTE — Progress Notes (Signed)
   OBSTETRICS PRENATAL VIRTUAL VISIT ENCOUNTER NOTE  Provider location: [facility]   I connected with Michelle Carr on 10/15/20 at  9:15 AM EST by MyChart Video Encounter at home and verified that I am speaking with the correct person using two identifiers.   I discussed the limitations, risks, security and privacy concerns of performing an evaluation and management service virtually and the availability of in person appointments. I also discussed with the patient that there may be a patient responsible charge related to this service. The patient expressed understanding and agreed to proceed. Subjective:  Michelle Carr is a 19 y.o. G1P0 at 105w0d being seen today for ongoing prenatal care.  She is currently monitored for the following issues for this low-risk pregnancy and has Dysmenorrhea; Family history of diabetes mellitus; Obesity; Supervision of normal first pregnancy; and Morning sickness on their problem list.  Patient reports no complaints. She endorses falling yesterday, but has no bleeding, pain, or leaking of fluid. Patient states she is not experiencing movement yet.  Contractions: Not present. Vag. Bleeding: None.   . Denies any leaking of fluid.   The following portions of the patient's history were reviewed and updated as appropriate: allergies, current medications, past family history, past medical history, past social history, past surgical history and problem list.   Objective:   Vitals:   10/15/20 0901  BP: 128/63  Pulse: 81    Fetal Status:           General:  Alert, oriented and cooperative. Patient is in no acute distress.  Respiratory: Normal respiratory effort, no problems with respiration noted  Mental Status: Normal mood and affect. Normal behavior. Normal judgment and thought content.  Rest of physical exam deferred due to type of encounter  Imaging: No results found.  Assessment and Plan:  Pregnancy: G1P0 at [redacted]w[redacted]d 1. Encounter  for supervision of normal first pregnancy in second trimester -Anticipatory guidance for upcoming visits. -Aware of [place] scheduled for 10/29/2020 -Desires next visit to be in-person if possible.   2. Status post fall -Doing well -Reassured that fetal movement should occur between 18-22 weeks.   3. [redacted] weeks gestation of pregnancy -Plan for AFP at [place] visit.  -Request added to chart.   4. Obesity affecting pregnancy in second trimester -Nulliparity and prepregnancy BMI 36 which makes patient candidate for aspirin initiation. -Reviewed research and recommendation for bASA initiation.  Patient agreeable. -Rx sent to pharmacy on file.    Preterm labor symptoms and general obstetric precautions including but not limited to vaginal bleeding, contractions, leaking of fluid and fetal movement were reviewed in detail with the patient. I discussed the assessment and treatment plan with the patient. The patient was provided an opportunity to ask questions and all were answered. The patient agreed with the plan and demonstrated an understanding of the instructions. The patient was advised to call back or seek an in-person office evaluation/go to [facility]U at [facility] for any urgent or concerning symptoms. Please refer to After Visit Summary for other counseling recommendations.   I provided 8 minutes of face-to-face time during this encounter.  No follow-ups on file.  Future Appointments  Date Time Provider Department Center  10/29/2020 10:45 AM [facility]    Cherre Robins, CNM  Center for [employer], [facility]

## 2020-10-15 NOTE — Patient Instructions (Signed)
Alpha-Fetoprotein Test Why am I having this test? The alpha-fetoprotein test is most commonly used in pregnant women to help screen for birth defects in their unborn baby. It can be used to screen for birth defects, such as chromosome (DNA) abnormalities, problems with the brain or spinal cord, or problems with the abdominal wall of the unborn baby (fetus). The alpha-fetoprotein test may also be done for men or non-pregnant women to check for certain cancers. What is being tested? This test measures the amount of alpha-fetoprotein (AFP) in your blood. AFP is a protein that is made by the liver. Levels can be detected in the mother's blood during pregnancy, starting at 10 weeks and peaking at 16-18 weeks of the pregnancy. Abnormal levels can sometimes be a sign of a birth defect in the baby. Certain cancers can cause a high level of AFP in men and non-pregnant women. What kind of sample is taken?  A blood sample is required for this test. It is usually collected by inserting a needle into a blood vessel. How are the results reported? Your test results will be reported as values. Your health care provider will compare your results to normal ranges that were established after testing a large group of people (reference values). Reference values may vary among labs and hospitals. For this test, common reference values are:  Adult: Less than 40 ng/mL or less than 40 mcg/L (SI units).  Child younger than 1 year: Less than 30 ng/mL. If you are pregnant, the values may also vary based on how long you have been pregnant. What do the results mean? Results that are above the reference values in pregnant women may indicate the following for the baby:  Neural tube defects, such as abnormalities of the spinal cord or brain.  Abdominal wall defects.  Multiple pregnancy such as twins.  Fetal distress or fetal death. Results that are above the reference values in men or non-pregnant women may  indicate:  Reproductive cancers, such as ovarian or testicular cancer.  Liver cancer.  Liver cell death.  Other types of cancer. Very low levels of AFP in pregnant women may indicate the following for the baby:  Down syndrome.  Fetal death. Talk with your health care provider about what your results mean. Questions to ask your health care provider Ask your health care provider, or the department that is doing the test:  When will my results be ready?  How will I get my results?  What are my treatment options?  What other tests do I need?  What are my next steps? Summary  The alpha-fetoprotein test is done on pregnant women to help screen for birth defects in their unborn baby.  Certain cancers can cause a high level of AFP in men and non-pregnant women.  For this test, a blood sample is usually collected by inserting a needle into a blood vessel.  Talk with your health care provider about what your results mean. This information is not intended to replace advice given to you by your health care provider. Make sure you discuss any questions you have with your health care provider. Document Revised: 10/27/2017 Document Reviewed: 06/20/2017 Elsevier Patient Education  2020 [employer]  

## 2020-10-15 NOTE — Progress Notes (Signed)
Pt was seen in MAU yesterday for recent fall.  Pt states she is doing well now.

## 2020-10-29 ENCOUNTER — Other Ambulatory Visit: Payer: Self-pay

## 2020-10-29 ENCOUNTER — Ambulatory Visit: Payer: Medicaid Other | Admitting: *Deleted

## 2020-10-29 ENCOUNTER — Other Ambulatory Visit: Payer: Self-pay | Admitting: *Deleted

## 2020-10-29 ENCOUNTER — Encounter: Payer: Self-pay | Admitting: *Deleted

## 2020-10-29 ENCOUNTER — Ambulatory Visit: Payer: Medicaid Other | Attending: Obstetrics and Gynecology

## 2020-10-29 VITALS — BP 109/56 | HR 83

## 2020-10-29 DIAGNOSIS — D571 Sickle-cell disease without crisis: Secondary | ICD-10-CM | POA: Diagnosis present

## 2020-10-29 DIAGNOSIS — Z3400 Encounter for supervision of normal first pregnancy, unspecified trimester: Secondary | ICD-10-CM | POA: Diagnosis not present

## 2020-10-29 DIAGNOSIS — Z362 Encounter for other antenatal screening follow-up: Secondary | ICD-10-CM

## 2020-11-02 ENCOUNTER — Telehealth: Payer: Self-pay | Admitting: Family Medicine

## 2020-11-02 NOTE — Telephone Encounter (Signed)
Pt call need copy of her immunization records

## 2020-11-02 NOTE — Telephone Encounter (Signed)
Immunization records printed and placed at front desk for pick up.   Call placed to patient and patient made aware.

## 2020-11-12 ENCOUNTER — Other Ambulatory Visit: Payer: Self-pay

## 2020-11-12 ENCOUNTER — Ambulatory Visit (INDEPENDENT_AMBULATORY_CARE_PROVIDER_SITE_OTHER): Payer: Medicaid Other

## 2020-11-12 VITALS — BP 117/68 | HR 96 | Wt 224.0 lb

## 2020-11-12 DIAGNOSIS — Z3A21 21 weeks gestation of pregnancy: Secondary | ICD-10-CM

## 2020-11-12 DIAGNOSIS — Z148 Genetic carrier of other disease: Secondary | ICD-10-CM

## 2020-11-12 DIAGNOSIS — Z3402 Encounter for supervision of normal first pregnancy, second trimester: Secondary | ICD-10-CM

## 2020-11-12 NOTE — Progress Notes (Signed)
   LOW-RISK PREGNANCY OFFICE VISIT  Patient name: Michelle Carr MRN [id number]  Date of birth: 2001-06-22 Chief Complaint:   Routine Prenatal Visit  Subjective:   Michelle Carr is a 19 y.o. G1P0 female at [redacted]w[redacted]d with an Estimated Date of Delivery: 03/25/21 being seen today for ongoing management of a low-risk pregnancy aeb has Dysmenorrhea; Family history of diabetes mellitus; Obesity; Supervision of normal first pregnancy; and Morning sickness on their problem list.  Patient presents today without complaints. Patient has not yet detected fetal movement and denies abdominal contractions.  She reports some occasional cramping  that resolves without intervention.  Patient has no vaginal concerns including abnormal discharge, leaking of fluid, and bleeding.  Contractions: Not present. Vag. Bleeding: None.  Movement: Absent.  Reviewed past medical,surgical, social, obstetrical and family history as well as problem list, medications and allergies.  Objective   Vitals:   11/12/20 1038  BP: 117/68  Pulse: 96  Weight: 224 lb (101.6 kg)  Body mass index is 38.45 kg/m.  Total Weight Gain:14 lb (6.35 kg)         Physical Examination:   General appearance: Well appearing, and in no distress  Mental status: Alert, oriented to person, place, and time  Skin: Warm & dry  Cardiovascular: Normal heart rate noted  Respiratory: Normal respiratory effort, no distress  Abdomen: Soft, gravid, nontender, AGA with Fundal height at umbilicus  Pelvic: Cervical exam deferred           Extremities: Edema: None  Fetal Status: Fetal Heart Rate (bpm): 150  Movement: Absent   No results found for this or any previous visit (from the past 24 hour(s)).  Assessment & Plan:  Low-risk pregnancy of a 19 y.o., G1P0 at [redacted]w[redacted]d with an Estimated Date of Delivery: 03/25/21   1. Encounter for supervision of normal first pregnancy in second trimester -Anticipatory guidance for upcoming appt.s -Patient initially expresses  interest in WB until she learned epidural not given!  -Reviewed other birthing techniques. -Encouraged to start looking for a pediatrician.  List given  2. [redacted] weeks gestation of pregnancy -Doing well -Patient aware of [place] carrier status and has no questions. Declines genetic counseling.      Meds: No orders of the defined types were placed in this encounter.  Labs/procedures today:  Lab Orders  No laboratory test(s) ordered today     Reviewed: Preterm labor symptoms and general obstetric precautions including but not limited to vaginal bleeding, contractions, leaking of fluid and fetal movement were reviewed in detail with the patient.  All questions were answered.  Follow-up: No follow-ups on file.  No orders of the defined types were placed in this encounter.  Cherre Robins MSN, CNM 11/12/2020

## 2020-11-12 NOTE — Patient Instructions (Signed)
AREA PEDIATRIC/FAMILY PRACTICE PHYSICIANS  [place] ([address]) . [facility] o Chambliss, MD; Eniola, MD; Hale, MD; Hensel, MD; McDiarmid, MD; McIntyer, MD; Neal, MD; Walden, MD o [address] [place], [place] [address] o [phone] o Mon-Fri 8:30-12:30, 1:30-5:00 o Providers come to see babies at [facility] o Accepting Medicaid . [facility] o Limited providers who accept newborns: Koirala, MD; Morrow, MD; Wolters, MD o [address], [place], [place] [address] o [phone] o Mon-Fri 8:00-5:30 o Babies seen by providers at [facility] o Does NOT accept Medicaid o Please call early in hospitalization for appointment (limited availability)  . [facility] o Mulberry, MD o [address] [place], [place] [address] o [phone] o Mon, Tue, Thur, Fri 8:30-5:00, Wed 10:00-7:00 (closed 1-2pm) o Babies seen by [facility] providers o Accepting Medicaid . [facility] Pediatrician o Rubin, MD o [address], [place], [place] [address] o [phone] o Mon-Fri 8:30-5:00, Sat 8:30-12:00 o Provider comes to see babies at [facility] o Accepting Medicaid o Must have been referred from current patients or contacted office prior to delivery . [facility] ([facility]) o Brown, MD; Chandler, MD; Ettefagh, MD; Grant, MD; Lester, MD; McCormick, MD; McQueen, MD; Prose, MD; Simha, MD; Stanley, MD; Stryffeler, NP; Tebben, NP o [address], [place], [place] [address] o [phone] o Mon, Tue, Thur, Fri 8:30-5:30, Wed 9:30-5:30, Sat 8:30-12:30 o Babies seen by [facility] providers o Accepting Medicaid o Only accepting infants of first-time parents or siblings of current patients o Hospital discharge coordinator will make follow-up appointment . Jack Amos o [address],  [place], [place]  [address] o [phone]   Fax - [phone] . [facility] o [address], [place], [place]  [address] o Phone - [phone]   Fax - [phone] . Shilpa Gosrani o [address], [place], [place]  [address] o [phone]  [place] ([address]) . [facility] o Bates, MD; Brassfield, MD; Cooper, Cox, MD; MD; Davis, MD; Dovico, MD; Ettefaugh, MD; Little, MD; Lowe, MD; Keiffer, MD; Melvin, MD; Sumner, MD; Williams, MD o [address], [place], [place] [address] o [phone] o Mon-Fri 8:30-5:00 (extended evenings Mon-Thur as needed), Sat-Sun 10:00-1:00 o Providers come to see babies at [facility] o Accepting Medicaid for families of first-time babies and families with all children in the household age 3 and under. Must register with office prior to making appointment (M-F only). . [facility] o Henson, NP; Knapp, MD; Lalonde, MD; Tysinger, PA o [address] [place], [place] [address] o [phone] o Mon-Fri 8:00-5:00 o Babies seen by providers at [facility] o Does NOT accept Medicaid/Commercial Insurance Only . [facility] ([facility])  o Artis, MD; Barnes, MD; Bratton, MD; Coccaro, MD; [place] Gardner, MD; Kramer, MD; Marshall, MD; Netherton, MD; Poleto, MD; Skinner, MD o [address] [place], [place] [address] o [phone] o Mon-Fri 8:30-5:30, Sat (Oct.-Mar.) 9:00-1:00 o Babies seen by providers at [facility] o Accepting Medicaid  [place] ([address]) . [facility] o Reid, MD; Warner, MD o [address], ,  [address] o [phone] o Mon-Fri 8:30-5:00, Sat 8:30-12:00 o Providers come to see babies at [facility] o Does NOT accept Medicaid . [facility] o Becker, PA; Hagler, MD; Scifres, PA; Sun, MD; Swayne, MD o [address],  [place], [place] [address] o [phone] o Mon-Fri 8:00-5:00 o Babies seen by providers at [facility] o Does NOT accept Medicaid o Only accepting babies of parents who are patients o Please call early in hospitalization for appointment (limited availability) . [facility] o Clark, MD; Frye, MD; Kelleher, MD; Mack, NP; Miller, MD; O'Keller, MD; Patterson, NP; Pudlo, MD; Puzio, MD; Thomas, MD; Tucker, MD; Twiselton, MD o [address], [place], [place] [address] o [phone] o Mon-Fri 8:00-5:00, Sat 9:00-12:00 o Providers come to see babies at [facility] o Does NOT accept Medicaid  [place] ([address]) . [facility] o Limited providers accepting new patients: Brake, NP; Wharton, PA o [address], [place], [place] [address] o [phone] o Mon-Fri 8:00-5:00 o Babies seen by providers at [facility] o Does NOT accept Medicaid o Only accepting babies of parents who are patients o Please call early in hospitalization for appointment (limited availability) . [facility] o Gay, MD; Quinlan, MD o [address] [place], [place] [address] o [phone] (press [phone] to schedule appointment) o Mon-Fri 8:00-5:00 o Providers come to see babies at [facility] o Does NOT accept Medicaid . [facility] o Mazer, MD o [address] [place], [place] [address] o [phone] o Mon-Fri 8:30-5:00 (lunch 12:30-1:00), extended hours by appointment only Wed 5:00-6:30 o Babies seen by [facility] providers o Accepting Medicaid . [facility] o Banks, MD; Jordan, MD; Koberlein, MD o [address], [place], [place] [address] o [phone] o Mon-Fri 8:00-5:00 o Babies seen by [facility] providers o Does NOT accept Medicaid . [facility] o Parker, MD; Hunter, MD; Wallace, DO o [address] [place], [place]  [address] o [phone] o Mon-Fri 8:00-5:00 o Babies seen by [facility] providers o Does NOT accept Medicaid . [facility] o Brandon, PA; Brecken, PA; Christy, NP; Dees, MD; DeClaire, MD; DeWeese, MD; Hansen, NP; Mills, NP; Parrish, NP; Smoot, NP; Summer, MD; Vapne, MD o [address] [place], [place] [address] o [phone] o Mon-Fri 8:30-5:00, Sat 10:00-1:00 o Providers come to see babies at [facility] o Does NOT accept Medicaid o Free prenatal information session Tuesdays at 4:45pm . [facility] o Bouska, MD; Gordon, PA; Jeffery, PA; Weber, PA o [address] [place] [address] o [phone] o Mon-Fri 7:30-5:30 o Babies seen by [facility] providers . [facility] o [address], [place], [place]  [address] o [phone]   Fax - [phone]  [place] ([address]) . [facility] o [place], [place] o [address] [place], [place] [address] o [phone] o Mon-Thur 8:00-6:00 o Providers come to see babies at [facility] o Accepting Medicaid . [facility] o Anderson, NP; Badger, MD; Beal, PA; Spencer, PA o [address] [place], [place] [address] o [phone] o Mon-Thur 7:30-7:30, Fri 7:30-4:30 o Babies seen by [facility] providers o Accepting Medicaid . [facility] o Agbuya, MD; Klett, NP; Romgoolam, MD o [address], [place], [place] [address] o [phone] o Mon-Fri 8:30-5:00, Sat 8:30-12:00 o Providers come to see babies at [facility] o Accepting Medicaid o Must have "Meet & Greet" appointment at office prior to delivery . [facility] ([facility] o McCord,   MD; Wallace, MD; Wood, MD o [address], [place], [place] [address] o [phone] o Mon-Wed 8:00-6:00, Thur-Fri 8:00-5:00, Sat 9:00-12:00 o Providers come to  see babies at [facility] o Does NOT accept Medicaid o Only accepting siblings of current patients . [facility]  o [address], [place], [place]  [address] o [phone]   Fax - [phone] . [facility] o [address], [place], [place]  [address] o [phone]   Fax - [phone]  [place] ([address]) . [facility] o Cirigliano, DO; Matthews, DO o [address] [place], [place] [address] o [phone] o Mon-Fri 7:00-5:00 o Babies seen by [facility] providers o Does NOT accept Medicaid . [facility] o Briscoe, MD; Howley, PA; Moreira, PA o [address], [place], [place] [address] o [phone] o Mon-Fri 8:00-5:00 o Babies seen by [facility] providers o Accepting Medicaid . [facility] o Boyd, MD; Church, PA; Jones, NP; Osborn, PA o [address], [place], [place] [address] o [phone] o Mon-Fri 8:00-5:00 o Babies seen by providers at [facility] o Accepting Medicaid  [place] ([address]) . [facility] [place] o Wendling, DO o [address] [place], [place] [address] o [phone] o Mon-Fri 8:00-5:00 o Babies seen by [facility] providers o Does NOT accept Medicaid o Limited availability, please call early in hospitalization to schedule follow-up . [facility] o Calderon, PA; Cummings, MD; Dillard, MD; Martin, PA; Olson, MD; VanDeven, PA o [address], [place], [place] [address] o [phone] o Mon-Fri 8:30-5:00, Sat 9:00-12:00 o Babies seen by providers at [facility] o Accepting Medicaid o Please register online then schedule online or call office o [email or web address] . [facility] ([facility] o Hunter, NP; Kumar, MD; [place] Rogers, PA o [address], [place], [place] [address] o [phone] o Mon-Fri 8:00-5:00 o Babies seen by providers at [facility] o Accepting Medicaid . [facility] ([facility] o Maumee, MD; Kristi Fleenor, NP; West, MD o [address], [place], [place] [address] o [phone] o Mon-Fri 8:00-5:30, Sat&Sun by appointment (phones open at 8:30) o Babies seen by [facility] providers o Accepting Medicaid o Must be a first-time baby or sibling of current patient . [facility] [place]  o [address], [place], [place]  [address] o [phone]   Fax - [phone]  [place] ([address]) . [facility] o Brown, PA; Cowen, PA; [place], MD; Helton, PA; Spry, MD o [address] [place], [place] [address] o [phone] o Mon-Thur 8:00-7:00, Fri 8:00-5:00, Sat 8:00-12:00, Sun 9:00-12:00 o Babies seen by [facility] providers o Accepting Medicaid . [facility] o Coe-Goins, MD; [place], MD; Pierre-Louis, MD o [address], [place],  [address] o [phone] o Mon-Thur 8:00-5:00 o Babies seen by providers at [facility] o Accepting Medicaid . [facility] o Bratton, MD; Coe-Goins, MD; Hayes, MD; Lewis, MD; List, MD; Lott, MD; [place], MD; Moran, MD; O'Neal, MD; Pierre-Louis, MD; Pitonzo, MD; Scholer, MD; Spangle, MD o [address] [place],    [address] o [phone] o Mon-Fri 8:00-5:30, Sat (Oct.-Mar.) 9:00-1:00 o Babies seen by providers at [facility] o Accepting Medicaid o Must fill out new patient packet, available online at [email or web address] . [facility] ([facility] o Friddle, NP; Harris, NP; Kelly, NP; Logan, MD;  Melvin, PA; Poth, MD; Ramadoss, MD; Stanton, NP o [address], [place], [place] [address] o [phone] o Mon-Thur 8:00-5:30, Fri 8:00-5:00 o Babies seen by providers at [facility] o Accepting Medicaid  [place] ([address]) . [facility] o Dixon, PA; [place], MD; Pickard, MD; Tapia, PA o [address], [place], [place] [address] o [phone] o Mon-Fri 8:00-5:00 o Babies seen by providers at [facility] o Accepting Medicaid   [place] ([address]) . [facility] [place] o Masneri, DO; Meyers, MD; Nelson, PA o [address], [place], [place] [address] o [phone] o Mon-Fri 8:00-5:00 o Babies seen by providers at [facility] o Does NOT accept Medicaid o Limited appointment availability, please call early in hospitalization  . [facility] [place] o Kunedd, DO; McGowen, MD o [address], [place], [place] [address] o [phone] o Mon-Fri 8:00-5:00 o Babies seen by [facility] providers o Does NOT accept Medicaid . [facility] [place] o Cameron, MD; MacDonald, MD; Michaels, PA; Nayak, MD o [address] [place] [address], [place], [place] [address] o [phone] o Mon-Fri 8:00-5:00 o After hours clinic ([address] [place], [place] [address]) [phone] Mon-Fri 5:00-8:00, Sat 12:00-6:00, Sun 10:00-4:00 o Babies seen by [facility] providers o Accepting Medicaid . [facility] [place] o [address], [place], [place]  [address] o [phone]   Fax - [phone]  [place] ([address]) . [facility] [place] [facility] o Andy, MD o [address], [place], [place] [address] o [phone] o Mon-Fri 8:00-5:00 o Babies seen by [facility] providers o Does NOT accept Medicaid . [facility] [place] ([facility] [place]) o Eksir, MD o [address], [place], [place]  [address] o [phone] o Mon-Thur 8:00-7:00, Fri 8:00-5:00, Sat 8:00-12:00 o Babies seen by providers at [facility] o Accepting Medicaid - but does not have vaccinations in office (must be received elsewhere) o Limited availability, please call early in hospitalization  [place] ([address]) . [facility]  o Charlene Flemming, MD o [address], [place] [address] o [phone]  Fax [phone]   

## 2020-11-12 NOTE — Progress Notes (Signed)
20w ROB  Anatomy U/S done 10/29/20. Noted for pt to return in 4 wks for completion of fetal anatomy.  CC: None

## 2020-11-26 ENCOUNTER — Encounter: Payer: Self-pay | Admitting: *Deleted

## 2020-11-26 ENCOUNTER — Ambulatory Visit: Payer: Medicaid Other | Admitting: *Deleted

## 2020-11-26 ENCOUNTER — Other Ambulatory Visit: Payer: Self-pay

## 2020-11-26 ENCOUNTER — Ambulatory Visit: Payer: Medicaid Other | Attending: Obstetrics and Gynecology

## 2020-11-26 VITALS — BP 107/57 | HR 87

## 2020-11-26 DIAGNOSIS — Z6836 Body mass index (BMI) 36.0-36.9, adult: Secondary | ICD-10-CM | POA: Insufficient documentation

## 2020-11-26 DIAGNOSIS — Z362 Encounter for other antenatal screening follow-up: Secondary | ICD-10-CM | POA: Diagnosis not present

## 2020-11-26 DIAGNOSIS — Z148 Genetic carrier of other disease: Secondary | ICD-10-CM

## 2020-11-26 DIAGNOSIS — E669 Obesity, unspecified: Secondary | ICD-10-CM | POA: Diagnosis not present

## 2020-11-26 DIAGNOSIS — Z3A23 23 weeks gestation of pregnancy: Secondary | ICD-10-CM

## 2020-11-26 DIAGNOSIS — O99212 Obesity complicating pregnancy, second trimester: Secondary | ICD-10-CM | POA: Diagnosis not present

## 2020-11-28 NOTE — L&D Delivery Note (Signed)
Delivery Note Called to bedside and patient complete and pushing. At 4:39 AM a viable female was delivered via Vaginal, Spontaneous (Presentation: Right Occiput Anterior).  APGAR: 8, 9; weight pending. No nuchal cord noted at delivery. Placenta status: Spontaneous, Intact.  Cord: 3 vessels with the following complications: None. Post placental liletta inserted without difficulty, refer to procedure note for further details.  Anesthesia: Epidural Episiotomy: None Lacerations: Left Sulcus Suture Repair: 3.0 vicryl Est. Blood Loss (mL): 100  Mom to postpartum.  Baby to Couplet care / Skin to Skin.  Alric Seton 03/30/2021, 5:16 AM

## 2020-11-30 ENCOUNTER — Other Ambulatory Visit: Payer: Self-pay | Admitting: *Deleted

## 2020-11-30 DIAGNOSIS — Z362 Encounter for other antenatal screening follow-up: Secondary | ICD-10-CM

## 2020-12-03 ENCOUNTER — Other Ambulatory Visit: Payer: Self-pay

## 2020-12-03 ENCOUNTER — Inpatient Hospital Stay (HOSPITAL_COMMUNITY)
Admission: AD | Admit: 2020-12-03 | Discharge: 2020-12-03 | Disposition: A | Discharge status: Home / Self Care | Payer: Medicaid Other | Attending: Family Medicine | Admitting: Family Medicine

## 2020-12-03 ENCOUNTER — Encounter (HOSPITAL_COMMUNITY): Payer: Self-pay | Admitting: Family Medicine

## 2020-12-03 DIAGNOSIS — O368120 Decreased fetal movements, second trimester, not applicable or unspecified: Secondary | ICD-10-CM | POA: Diagnosis not present

## 2020-12-03 DIAGNOSIS — Z3A24 24 weeks gestation of pregnancy: Secondary | ICD-10-CM | POA: Diagnosis not present

## 2020-12-03 DIAGNOSIS — Z3689 Encounter for other specified antenatal screening: Secondary | ICD-10-CM

## 2020-12-03 NOTE — MAU Provider Note (Signed)
Event Date/Time   First Provider Initiated Contact with Patient 12/03/20 2010     S Ms. Michelle Carr is a 20 y.o. G1P0 pregnant female at [redacted]w[redacted]d who presents to MAU today with complaint of decreased fetal movement for the past two days. She states baby has been breech at previous ultrasounds and she can usually feel kicks on her bladder but has not felt those for two days. She denies contractions, cramping, vaginal bleeding or LOF. No recent illnesses.   O BP 132/73 (BP Location: Right Arm)   Pulse 93   Temp 98.5 F (36.9 C) (Oral)   Resp 16   LMP 05/29/2020   SpO2 100%  Physical Exam Vitals and nursing note reviewed.  Constitutional:      General: She is not in acute distress.    Appearance: Normal appearance. She is normal weight. She is not ill-appearing.  Eyes:     Pupils: Pupils are equal, round, and reactive to light.  Cardiovascular:     Rate and Rhythm: Normal rate and regular rhythm.     Pulses: Normal pulses.  Pulmonary:     Effort: Pulmonary effort is normal.  Abdominal:     General: There is no distension.     Palpations: Abdomen is soft.     Tenderness: There is no abdominal tenderness. There is no guarding.     Comments: Gravid, baby now transverse by Leopold's  Musculoskeletal:        General: Normal range of motion.     Cervical back: Normal range of motion.  Skin:    General: Skin is warm and dry.     Capillary Refill: Capillary refill takes less than 2 seconds.  Neurological:     Mental Status: She is alert and oriented to person, place, and time.  Psychiatric:        Mood and Affect: Mood normal.        Behavior: Behavior normal.        Thought Content: Thought content normal.        Judgment: Judgment normal.    Fetal Tracing: reactive (difficult to trace continually because of fetal movement) Baseline: 140 Variability: moderate, appropriate for gestational age Accelerations: 10x10 Decelerations: none Toco: relaxed  Movement palpable to myself  and patient during Leopold's, heard during NST. Reassured patient that difficulty palpating movement is related to anterior placenta and positioning of the baby. Showed her how to palpate and stretches to assist proper positioning/alignment. Mom verbalized understanding and relief, confident in ability to feel movement now.  A Decreased fetal movement NST reactive  P Discharge from MAU in stable condition with 2nd trimester precautions Follow up as scheduled at [facility]  Bernerd Limbo, CNM 12/03/2020 8:11 PM

## 2020-12-03 NOTE — MAU Note (Signed)
Michelle Carr is a 20 y.o. at [redacted]w[redacted]d here in MAU reporting: DFM since Tuesday night, states she has not felt any movement since this time. Denies any bleeding, pain, or LOF.  Onset of complaint: Tuesday night  Pain score: 0/10  Vitals:   12/03/20 1816  BP: 132/73  Pulse: 93  Resp: 16  Temp: 98.5 F (36.9 C)  SpO2: 100%     FHT: 150  Lab orders placed from triage: none

## 2020-12-03 NOTE — Discharge Instructions (Signed)
Second Trimester of Pregnancy  The second trimester is from week 14 through week 27 (month 4 through 6). This is often the time in pregnancy that you feel your best. Often times, morning sickness has lessened or quit. You may have more energy, and you may get hungry more often. Your unborn baby is growing rapidly. At the end of the sixth month, he or she is about 9 inches long and weighs about 1 pounds. You will likely feel the baby move between 18 and 20 weeks of pregnancy. Follow these instructions at home: Medicines  Take over-the-counter and prescription medicines only as told by your doctor. Some medicines are safe and some medicines are not safe during pregnancy.  Take a prenatal vitamin that contains at least 600 micrograms (mcg) of folic acid.  If you have trouble pooping (constipation), take medicine that will make your stool soft (stool softener) if your doctor approves. Eating and drinking   Eat regular, healthy meals.  Avoid raw meat and uncooked cheese.  If you get low calcium from the food you eat, talk to your doctor about taking a daily calcium supplement.  Avoid foods that are high in fat and sugars, such as fried and sweet foods.  If you feel sick to your stomach (nauseous) or throw up (vomit): ? Eat 4 or 5 small meals a day instead of 3 large meals. ? Try eating a few soda crackers. ? Drink liquids between meals instead of during meals.  To prevent constipation: ? Eat foods that are high in fiber, like fresh fruits and vegetables, whole grains, and beans. ? Drink enough fluids to keep your pee (urine) clear or pale yellow. Activity  Exercise only as told by your doctor. Stop exercising if you start to have cramps.  Do not exercise if it is too hot, too humid, or if you are in a place of great height (high altitude).  Avoid heavy lifting.  Wear low-heeled shoes. Sit and stand up straight.  You can continue to have sex unless your doctor tells you not  to. Relieving pain and discomfort  Wear a good support bra if your breasts are tender.  Take warm water baths (sitz baths) to soothe pain or discomfort caused by hemorrhoids. Use hemorrhoid cream if your doctor approves.  Rest with your legs raised if you have leg cramps or low back pain.  If you develop puffy, bulging veins (varicose veins) in your legs: ? Wear support hose or compression stockings as told by your doctor. ? Raise (elevate) your feet for 15 minutes, 3-4 times a day. ? Limit salt in your food. Prenatal care  Write down your questions. Take them to your prenatal visits.  Keep all your prenatal visits as told by your doctor. This is important. Safety  Wear your seat belt when driving.  Make a list of emergency phone numbers, including numbers for family, friends, the hospital, and police and fire departments. General instructions  Ask your doctor about the right foods to eat or for help finding a counselor, if you need these services.  Ask your doctor about local prenatal classes. Begin classes before month 6 of your pregnancy.  Do not use hot tubs, steam rooms, or saunas.  Do not douche or use tampons or scented sanitary pads.  Do not cross your legs for long periods of time.  Visit your dentist if you have not done so. Use a soft toothbrush to brush your teeth. Floss gently.  Avoid all smoking, herbs,   and alcohol. Avoid drugs that are not approved by your doctor.  Do not use any products that contain nicotine or tobacco, such as cigarettes and e-cigarettes. If you need help quitting, ask your doctor.  Avoid cat litter boxes and soil used by cats. These carry germs that can cause birth defects in the baby and can cause a loss of your baby (miscarriage) or stillbirth. Contact a doctor if:  You have mild cramps or pressure in your lower belly.  You have pain when you pee (urinate).  You have bad smelling fluid coming from your vagina.  You continue to  feel sick to your stomach (nauseous), throw up (vomit), or have watery poop (diarrhea).  You have a nagging pain in your belly area.  You feel dizzy. Get help right away if:  You have a fever.  You are leaking fluid from your vagina.  You have spotting or bleeding from your vagina.  You have severe belly cramping or pain.  You lose or gain weight rapidly.  You have trouble catching your breath and have chest pain.  You notice sudden or extreme puffiness (swelling) of your face, hands, ankles, feet, or legs.  You have not felt the baby move in over an hour.  You have severe headaches that do not go away when you take medicine.  You have trouble seeing. Summary  The second trimester is from week 14 through week 27 (months 4 through 6). This is often the time in pregnancy that you feel your best.  To take care of yourself and your unborn baby, you will need to eat healthy meals, take medicines only if your doctor tells you to do so, and do activities that are safe for you and your baby.  Call your doctor if you get sick or if you notice anything unusual about your pregnancy. Also, call your doctor if you need help with the right food to eat, or if you want to know what activities are safe for you. This information is not intended to replace advice given to you by your health care provider. Make sure you discuss any questions you have with your health care provider. Document Revised: 03/08/2019 Document Reviewed: 12/20/2016 Elsevier Patient Education  2020 Elsevier Inc.  Fetal Movement Counts Patient Name: ________________________________________________ Patient Due Date: ____________________ What is a fetal movement count?  A fetal movement count is the number of times that you feel your baby move during a certain amount of time. This may also be called a fetal kick count. A fetal movement count is recommended for every pregnant woman. You may be asked to start counting fetal  movements as early as week 28 of your pregnancy. Pay attention to when your baby is most active. You may notice your baby's sleep and wake cycles. You may also notice things that make your baby move more. You should do a fetal movement count:  When your baby is normally most active.  At the same time each day. A good time to count movements is while you are resting, after having something to eat and drink. How do I count fetal movements? 1. Find a quiet, comfortable area. Sit, or lie down on your side. 2. Write down the date, the start time and stop time, and the number of movements that you felt between those two times. Take this information with you to your health care visits. 3. Write down your start time when you feel the first movement. 4. Count kicks, flutters, swishes, rolls,  and jabs. You should feel at least 10 movements. 5. You may stop counting after you have felt 10 movements, or if you have been counting for 2 hours. Write down the stop time. 6. If you do not feel 10 movements in 2 hours, contact your health care provider for further instructions. Your health care provider may want to do additional tests to assess your baby's well-being. Contact a health care provider if:  You feel fewer than 10 movements in 2 hours.  Your baby is not moving like he or she usually does. Date: ____________ Start time: ____________ Stop time: ____________ Movements: ____________ Date: ____________ Start time: ____________ Stop time: ____________ Movements: ____________ Date: ____________ Start time: ____________ Stop time: ____________ Movements: ____________ Date: ____________ Start time: ____________ Stop time: ____________ Movements: ____________ Date: ____________ Start time: ____________ Stop time: ____________ Movements: ____________ Date: ____________ Start time: ____________ Stop time: ____________ Movements: ____________ Date: ____________ Start time: ____________ Stop time: ____________  Movements: ____________ Date: ____________ Start time: ____________ Stop time: ____________ Movements: ____________ Date: ____________ Start time: ____________ Stop time: ____________ Movements: ____________ This information is not intended to replace advice given to you by your health care provider. Make sure you discuss any questions you have with your health care provider. Document Revised: 07/04/2019 Document Reviewed: 07/04/2019 Elsevier Patient Education  2020 [employer].

## 2020-12-10 ENCOUNTER — Telehealth (INDEPENDENT_AMBULATORY_CARE_PROVIDER_SITE_OTHER): Payer: Medicaid Other | Admitting: Certified Nurse Midwife

## 2020-12-10 ENCOUNTER — Other Ambulatory Visit: Payer: Self-pay

## 2020-12-10 ENCOUNTER — Encounter: Payer: Self-pay | Admitting: Certified Nurse Midwife

## 2020-12-10 VITALS — BP 136/68 | HR 84

## 2020-12-10 DIAGNOSIS — O99210 Obesity complicating pregnancy, unspecified trimester: Secondary | ICD-10-CM | POA: Insufficient documentation

## 2020-12-10 DIAGNOSIS — O99212 Obesity complicating pregnancy, second trimester: Secondary | ICD-10-CM

## 2020-12-10 DIAGNOSIS — E669 Obesity, unspecified: Secondary | ICD-10-CM

## 2020-12-10 DIAGNOSIS — Z3400 Encounter for supervision of normal first pregnancy, unspecified trimester: Secondary | ICD-10-CM

## 2020-12-10 DIAGNOSIS — Z3A25 25 weeks gestation of pregnancy: Secondary | ICD-10-CM

## 2020-12-10 NOTE — Progress Notes (Signed)
I connected with  Michelle Carr on 12/10/20 by a video enabled telemedicine application and verified that I am speaking with the correct person using two identifiers.   I discussed the limitations of evaluation and management by telemedicine. The patient expressed understanding and agreed to proceed.   Mychart OB, reports no problems today.

## 2020-12-10 NOTE — Patient Instructions (Signed)
Oral Glucose Tolerance Test During Pregnancy Why am I having this test? The oral glucose tolerance test (OGTT) is done to check how your body processes blood sugar (glucose). This is one of several tests used to diagnose diabetes that develops during pregnancy (gestational diabetes mellitus). Gestational diabetes is a short-term form of diabetes that some women develop while they are pregnant. It usually occurs during the second trimester of pregnancy and goes away after delivery. Testing, or screening, for gestational diabetes usually occurs at weeks 24-28 of pregnancy. You may have the OGTT test after having a 1-hour glucose screening test if the results from that test indicate that you may have gestational diabetes. This test may also be needed if:  You have a history of gestational diabetes.  There is a history of giving birth to very large babies or of losing pregnancies (having stillbirths).  You have signs and symptoms of diabetes, such as: ? Changes in your eyesight. ? Tingling or numbness in your hands or feet. ? Changes in hunger, thirst, and urination, and these are not explained by your pregnancy. What is being tested? This test measures the amount of glucose in your blood at different times during a period of 3 hours. This shows how well your body can process glucose. What kind of sample is taken? Blood samples are required for this test. They are usually collected by inserting a needle into a blood vessel.   How do I prepare for this test?  For 3 days before your test, eat normally. Have plenty of carbohydrate-rich foods.  Follow instructions from your health care provider about: ? Eating or drinking restrictions on the day of the test. You may be asked not to eat or drink anything other than water (to fast) starting 8-10 hours before the test. ? Changing or stopping your regular medicines. Some medicines may interfere with this test. Tell a health care provider about:  All  medicines you are taking, including vitamins, herbs, eye drops, creams, and over-the-counter medicines.  Any blood disorders you have.  Any surgeries you have had.  Any medical conditions you have. What happens during the test? First, your blood glucose will be measured. This is referred to as your fasting blood glucose because you fasted before the test. Then, you will drink a glucose solution that contains a certain amount of glucose. Your blood glucose will be measured again 1, 2, and 3 hours after you drink the solution. This test takes about 3 hours to complete. You will need to stay at the testing location during this time. During the testing period:  Do not eat or drink anything other than the glucose solution.  Do not exercise.  Do not use any products that contain nicotine or tobacco, such as cigarettes, e-cigarettes, and chewing tobacco. These can affect your test results. If you need help quitting, ask your health care provider. The testing procedure may vary among health care providers and hospitals. How are the results reported? Your results will be reported as milligrams of glucose per deciliter of blood (mg/dL) or millimoles per liter (mmol/L). There is more than one source for screening and diagnosis reference values used to diagnose gestational diabetes. Your health care provider will compare your results to normal values that were established after testing a large group of people (reference values). Reference values may vary among labs and hospitals. For this test (Carpenter-Coustan), reference values are:  Fasting: 95 mg/dL (5.3 mmol/L).  1 hour: 180 mg/dL (10.0 mmol/L).  2 hour:   155 mg/dL (8.6 mmol/L).  3 hour: 140 mg/dL (7.8 mmol/L). What do the results mean? Results below the reference values are considered normal. If two or more of your blood glucose levels are at or above the reference values, you may be diagnosed with gestational diabetes. If only one level is  high, your health care provider may suggest repeat testing or other tests to confirm a diagnosis. Talk with your health care provider about what your results mean. Questions to ask your health care provider Ask your health care provider, or the department that is doing the test:  When will my results be ready?  How will I get my results?  What are my treatment options?  What other tests do I need?  What are my next steps? Summary  The oral glucose tolerance test (OGTT) is one of several tests used to diagnose diabetes that develops during pregnancy (gestational diabetes mellitus). Gestational diabetes is a short-term form of diabetes that some women develop while they are pregnant.  You may have the OGTT test after having a 1-hour glucose screening test if the results from that test show that you may have gestational diabetes. You may also have this test if you have any symptoms or risk factors for this type of diabetes.  Talk with your health care provider about what your results mean. This information is not intended to replace advice given to you by your health care provider. Make sure you discuss any questions you have with your health care provider. Document Revised: 04/23/2020 Document Reviewed: 04/23/2020 Elsevier Patient Education  2021 Elsevier Inc.  

## 2020-12-10 NOTE — Progress Notes (Signed)
OBSTETRICS PRENATAL VIRTUAL VISIT ENCOUNTER NOTE  [facility]   I connected with Michelle Carr on 12/10/20 at  9:52 AM EST by MyChart Video Encounter at home and verified that I am speaking with the correct person using two identifiers.   I discussed the limitations, risks, security and privacy concerns of performing an evaluation and management service virtually and the availability of in person appointments. I also discussed with the patient that there may be a patient responsible charge related to this service. The patient expressed understanding and agreed to proceed. Subjective:  Michelle Carr is a 20 y.o. G1P0 at 5938w0d being seen today for ongoing prenatal care.  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  Patient reports no complaints.  Contractions: Not present. Vag. Bleeding: None.  Movement: Present. Denies any leaking of fluid.   The following portions of the patient's history were reviewed and updated as appropriate: allergies, current medications, past family history, past medical history, past social history, past surgical history and problem list.   Objective:   Vitals:   12/10/20 0926  BP: 136/68  Pulse: 84    Fetal Status:     Movement: Present     General:  Alert, oriented and cooperative. Patient is in no acute distress.  Respiratory: Normal respiratory effort, no problems with respiration noted  Mental Status: Normal mood and affect. Normal behavior. Normal judgment and thought content.  Rest of physical exam deferred due to type of encounter  Imaging: US MFM OB FOLLOW UP  Result Date: 11/26/2020 ----------------------------------------------------------------------  OBSTETRICS REPORT                        (Signed Final 11/26/2020 04:44 pm) ---------------------------------------------------------------------- Patient Info  ID #:       [id number]                          D.O.B.:  2001-01-01 (19 yrs)  Name:       Michelle Carr                   Visit Date: 11/26/2020 02:01 pm ---------------------------------------------------------------------- Performed By  Attending:        Noralee Spaceavi Shankar MD        Ref. Address:     [address]                                                             [address]                                                             [phone]  [place]                                                             [phone]  Performed By:     Tommie RaymondMesha Tester BS,       [facility]  [facility]      [facility] ---------------------------------------------------------------------- Orders  #  Description                           Code        Ordered By  1  US MFM OB FOLLOW UP                   [id number]    Noralee SpaceAVI SHANKAR ----------------------------------------------------------------------  #  Order #                     Accession #                Episode #  1  [id number]                   [phone]                 [id number] ---------------------------------------------------------------------- Indications  [redacted] weeks gestation of pregnancy                Z3A.23  Genetic carrier (Sickle Cell Carrier)          Z14.8  Encounter for other antenatal screening        Z36.2  follow-up  LR NIPS  Obesity complicating pregnancy, second         O99.212  trimester ---------------------------------------------------------------------- Fetal Evaluation  Num Of Fetuses:         1  Cardiac Activity:       Observed   Presentation:           Cephalic  Placenta:               Anterior  P. Cord Insertion:      Previously Visualized  Amniotic Fluid  AFI FV:      Within normal limits  Largest Pocket(cm)                              5.9 ---------------------------------------------------------------------- Biometry  BPD:        57  mm     G. Age:  23w 3d         62  %    CI:        76.16   %    70 - 86                                                          FL/HC:      20.1   %    19.2 - 20.8  HC:       207   mm     G. Age:  22w 6d         27  %    HC/AC:      1.06        1.05 - 1.21  AC:      195.4  mm     G. Age:  24w 2d         79  %    FL/BPD:     73.0   %    71 - 87  FL:       41.6  mm     G. Age:  23w 4d         56  %    FL/AC:      21.3   %    20 - 24  CER:      25.4  mm     G. Age:  23w 1d         82  %  LV:        6.3  mm  CM:        5.1  mm  Est. FW:     629  gm      1 lb 6 oz     80  % ---------------------------------------------------------------------- OB History  Gravidity:    1         Term:   0  Living:       0 ---------------------------------------------------------------------- Gestational Age  LMP:           25w 6d        Date:  05/29/20                 EDD:   03/05/21  U/S Today:     23w 4d                                        EDD:   03/21/21  Best:          23w 0d     Det. By:  Previous Ultrasound      EDD:   03/25/21                                      (07/29/20) ---------------------------------------------------------------------- Anatomy  Cranium:  Appears normal         Aortic Arch:            Previously seen  Cavum:                 Appears normal         Ductal Arch:            Previously seen  Ventricles:            Appears normal         Diaphragm:              Appears normal  Choroid Plexus:        Appears normal         Stomach:                Appears normal, left                                                                        sided  Cerebellum:             Appears normal         Abdomen:                Appears normal  Posterior Fossa:       Appears normal         Abdominal Wall:         Appears nml (cord                                                                        insert, abd wall)  Nuchal Fold:           Previously seen        Cord Vessels:           Appears normal (3                                                                        vessel cord)  Face:                  Orbits nl; profile not Kidneys:                Appear normal                         well visualized  Lips:                  Appears normal         Bladder:                Appears normal  Thoracic:  Appears normal         Spine:                  Appears normal  Heart:                 Appears normal         Upper Extremities:      Appears normal                         (4CH, axis, and                         situs)  RVOT:                  Appears normal         Lower Extremities:      Appears normal  LVOT:                  Appears normal  Other:  Fetus appears to be female. Heels and Left 5th digit visualized. ---------------------------------------------------------------------- Cervix Uterus Adnexa  Cervix  Length:           3.74  cm.  Normal appearance by transabdominal scan.  Uterus  No abnormality visualized.  Right Ovary  Within normal limits.  Left Ovary  Within normal limits.  Cul De Sac  No free fluid seen.  Adnexa  No abnormality visualized. ---------------------------------------------------------------------- Impression  Patient returned for completion of fetal anatomy .Fetal  biometry is consistent with her previously-established dates  .Amniotic fluid is normal and good fetal activity is seen .Fetal  anatomical survey was completed (except fetal profile) and  appears normal.  Maternal obesity imposes limitations on the resolution of  images, and failure to detect fetal anomalies is more common  in obese pregnant women.  ---------------------------------------------------------------------- Recommendations  -An appointment was made for her to return in 4 weeks for  fetal growth assessment. ----------------------------------------------------------------------                  Noralee Space, MD Electronically Signed Final Report   11/26/2020 04:44 pm ----------------------------------------------------------------------   Assessment and Plan:  Pregnancy: G1P0 at [redacted]w[redacted]d 1. Supervision of normal first pregnancy, antepartum - Patient doing well, no complaints  - Routine prenatal care - Anticipatory guidance on upcoming appointments   2. [redacted] weeks gestation of pregnancy - GTT at next appointment  - Educated and discussed GTT with next appointment and to present fasting after MN prior to appointment   3. Obesity during pregnancy, antepartum - serial fetal growth ultrasound as scheduled    Preterm labor symptoms and general obstetric precautions including but not limited to vaginal bleeding, contractions, leaking of fluid and fetal movement were reviewed in detail with the patient. I discussed the assessment and treatment plan with the patient. The patient was provided an opportunity to ask questions and all were answered. The patient agreed with the plan and demonstrated an understanding of the instructions. The patient was advised to call back or seek an in-person office evaluation/go to [facility] at [facility] for any urgent or concerning symptoms. Please refer to After Visit Summary for other counseling recommendations.   I provided 11 minutes of face-to-face time during this encounter.  Return in about 4 weeks (around 01/07/2021) for LROB, GTT, in person.  Future Appointments  Date Time Provider Department Center  01/21/2021  9:30 AM [facility] NURSE [facility]  01/21/2021  9:45 AM [facility] US5 [facility]    Sharyon Cable, CNM [facility] [employer], [facility]

## 2021-01-07 ENCOUNTER — Other Ambulatory Visit: Payer: Medicaid Other

## 2021-01-07 ENCOUNTER — Encounter: Payer: Self-pay | Admitting: Obstetrics and Gynecology

## 2021-01-07 ENCOUNTER — Other Ambulatory Visit: Payer: Self-pay

## 2021-01-07 ENCOUNTER — Ambulatory Visit (INDEPENDENT_AMBULATORY_CARE_PROVIDER_SITE_OTHER): Payer: Medicaid Other | Admitting: Obstetrics and Gynecology

## 2021-01-07 VITALS — BP 108/64 | HR 94 | Wt 235.0 lb

## 2021-01-07 DIAGNOSIS — Z148 Genetic carrier of other disease: Secondary | ICD-10-CM

## 2021-01-07 DIAGNOSIS — Z3403 Encounter for supervision of normal first pregnancy, third trimester: Secondary | ICD-10-CM

## 2021-01-07 MED ORDER — DOCUSATE SODIUM 100 MG PO CAPS: 100.0000 mg | ORAL_CAPSULE | Freq: Two times a day (BID) | ORAL | 3 refills | Status: DC

## 2021-01-07 NOTE — Progress Notes (Signed)
   PRENATAL VISIT NOTE  Subjective:  Michelle Carr is a 20 y.o. G1P0 at [redacted]w[redacted]d being seen today for ongoing prenatal care.  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  Patient reports constipation, pain with BM.  Contractions: Not present. Vag. Bleeding: None.  Movement: Present. Denies leaking of fluid.   The following portions of the patient's history were reviewed and updated as appropriate: allergies, current medications, past family history, past medical history, past social history, past surgical history and problem list.   Objective:   Vitals:   01/07/21 0959  BP: 108/64  Pulse: 94  Weight: 235 lb (106.6 kg)    Fetal Status: Fetal Heart Rate (bpm): 145 Fundal Height: 28 cm Movement: Present     General:  Alert, oriented and cooperative. Patient is in no acute distress.  Skin: Skin is warm and dry. No rash noted.   Cardiovascular: Normal heart rate noted  Respiratory: Normal respiratory effort, no problems with respiration noted  Abdomen: Soft, gravid, appropriate for gestational age.  Pain/Pressure: Absent     Pelvic: Cervical exam deferred        Extremities: Normal range of motion.  Edema: Trace  Mental Status: Normal mood and affect. Normal behavior. Normal judgment and thought content.   Assessment and Plan:  Pregnancy: G1P0 at [redacted]w[redacted]d 1. Encounter for supervision of normal first pregnancy in third trimester  - Glucose Tolerance, 2 Hours w/1 Hour - HIV Antibody (routine testing w rflx) - RPR - CBC - Continue [place] for growth.  - Continue BASA  - Rx: Colace.   2. 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 Please refer to After Visit Summary for other counseling recommendations.   Return in about 2 weeks (around 01/21/2021).  Future Appointments  Date Time Provider Department Center  01/21/2021  9:30 AM [facility] NURSE [facility]  01/21/2021  9:45 AM [facility]    Venia Carbon, NP

## 2021-01-07 NOTE — Progress Notes (Signed)
Pt presents for ROB and 2 gtt labs. Tdap offered; pt declined.  

## 2021-01-08 LAB — CBC
Hematocrit: 31.9 % — ABNORMAL LOW (ref 34.0–46.6)
Hemoglobin: 11.2 g/dL (ref 11.1–15.9)
MCH: 29.6 pg (ref 26.6–33.0)
MCHC: 35.1 g/dL (ref 31.5–35.7)
MCV: 84 fL (ref 79–97)
Platelets: 220 10*3/uL (ref 150–450)
RBC: 3.79 x10E6/uL (ref 3.77–5.28)
RDW: 13.3 % (ref 11.7–15.4)
WBC: 7.2 10*3/uL (ref 3.4–10.8)

## 2021-01-08 LAB — GLUCOSE TOLERANCE, 2 HOURS W/ 1HR
Glucose, 1 hour: 148 mg/dL (ref 65–179)
Glucose, 2 hour: 106 mg/dL (ref 65–152)
Glucose, Fasting: 80 mg/dL (ref 65–91)

## 2021-01-08 LAB — HIV ANTIBODY (ROUTINE TESTING W REFLEX): HIV Screen 4th Generation wRfx: NONREACTIVE

## 2021-01-08 LAB — RPR: RPR Ser Ql: NONREACTIVE

## 2021-01-12 ENCOUNTER — Other Ambulatory Visit: Payer: Self-pay

## 2021-01-12 ENCOUNTER — Ambulatory Visit (INDEPENDENT_AMBULATORY_CARE_PROVIDER_SITE_OTHER): Payer: Medicaid Other

## 2021-01-12 VITALS — BP 104/68 | HR 94 | Ht 65.0 in | Wt 233.0 lb

## 2021-01-12 DIAGNOSIS — Z23 Encounter for immunization: Secondary | ICD-10-CM | POA: Diagnosis not present

## 2021-01-12 NOTE — Progress Notes (Signed)
Pt presents today for Tdap vaccine. Tdap 0.14mL given R deltoid. Pt tolerated well with no adverse side effects. Pt has return OB appt on 01/22/21.

## 2021-01-15 ENCOUNTER — Inpatient Hospital Stay (HOSPITAL_COMMUNITY)
Admission: AD | Admit: 2021-01-15 | Discharge: 2021-01-16 | Disposition: A | Discharge status: Home / Self Care | Payer: Medicaid Other | Attending: Obstetrics & Gynecology | Admitting: Obstetrics & Gynecology

## 2021-01-15 DIAGNOSIS — M5430 Sciatica, unspecified side: Secondary | ICD-10-CM | POA: Insufficient documentation

## 2021-01-15 DIAGNOSIS — Z3689 Encounter for other specified antenatal screening: Secondary | ICD-10-CM

## 2021-01-15 DIAGNOSIS — M5440 Lumbago with sciatica, unspecified side: Secondary | ICD-10-CM

## 2021-01-15 DIAGNOSIS — O99353 Diseases of the nervous system complicating pregnancy, third trimester: Secondary | ICD-10-CM | POA: Insufficient documentation

## 2021-01-15 DIAGNOSIS — Z3A30 30 weeks gestation of pregnancy: Secondary | ICD-10-CM | POA: Insufficient documentation

## 2021-01-15 NOTE — MAU Note (Signed)
Patient reports having intermittent tailbone pains & lower back pain that radiates to her abdomen.  Also reports feeling her baby move a lot lower than usual. Reports her frequency of movements has not changed though.  Denies LOF/VB.  Was prescribed hemorrhoidal cream but states she doesn't have hemorrhoids and it has just made her symptoms worse.  Denies CTX.

## 2021-01-16 ENCOUNTER — Encounter (HOSPITAL_COMMUNITY): Payer: Self-pay | Admitting: Obstetrics & Gynecology

## 2021-01-16 DIAGNOSIS — O99891 Other specified diseases and conditions complicating pregnancy: Secondary | ICD-10-CM

## 2021-01-16 DIAGNOSIS — M549 Dorsalgia, unspecified: Secondary | ICD-10-CM | POA: Diagnosis present

## 2021-01-16 DIAGNOSIS — O99353 Diseases of the nervous system complicating pregnancy, third trimester: Secondary | ICD-10-CM | POA: Diagnosis not present

## 2021-01-16 DIAGNOSIS — M5440 Lumbago with sciatica, unspecified side: Secondary | ICD-10-CM | POA: Diagnosis not present

## 2021-01-16 DIAGNOSIS — Z3A30 30 weeks gestation of pregnancy: Secondary | ICD-10-CM

## 2021-01-16 DIAGNOSIS — M5430 Sciatica, unspecified side: Secondary | ICD-10-CM | POA: Diagnosis not present

## 2021-01-16 LAB — URINALYSIS, ROUTINE W REFLEX MICROSCOPIC
Bilirubin Urine: NEGATIVE
Glucose, UA: NEGATIVE mg/dL
Hgb urine dipstick: NEGATIVE
Ketones, ur: NEGATIVE mg/dL
Leukocytes,Ua: NEGATIVE
Nitrite: NEGATIVE
Protein, ur: NEGATIVE mg/dL
Specific Gravity, Urine: 1.011 (ref 1.005–1.030)
pH: 6 (ref 5.0–8.0)

## 2021-01-16 LAB — URINE CULTURE, OB REFLEX

## 2021-01-16 LAB — CULTURE, OB URINE

## 2021-01-16 NOTE — Discharge Instructions (Signed)

## 2021-01-16 NOTE — MAU Provider Note (Signed)
History     CSN: [id number]  Arrival date and time: 01/15/21 2326   Event Date/Time   First Provider Initiated Contact with Patient 01/16/21 0026      Chief Complaint  Patient presents with  . Back Pain  . Pelvic Pain   Michelle Carr is a 20 y.o. G1P0 at [redacted]w[redacted]d who receives care at [facility].  She presents today for Back Pain and Pelvic Pain. She states she has been having "rectal pain that is sharp and shoots up into my back and radiates to the front."  Patient states the pain is constant, but worsens with movement.  She states sitting and warm showers make the pain better.   Patient rates the pain 9/10 when it occurs, but a 0/10 now. Patient endorses fetal movement, but states the movement has shifted to the lower abdominal area.  Patient denies abdominal cramping or contractions as well as vaginal discharge, leaking, or bleeding.     OB History    Gravida  1   Para      Term      Preterm      AB      Living        SAB      IAB      Ectopic      Multiple      Live Births              Past Medical History:  Diagnosis Date  . Medical history non-contributory     Past Surgical History:  Procedure Laterality Date  . NO PAST SURGERIES      Family History  Problem Relation Age of Onset  . Hyperlipidemia Mother   . Hyperlipidemia Maternal Grandmother   . Diabetes Maternal Grandmother   . Hypertension Maternal Grandmother     Social History   Tobacco Use  . Smoking status: Never Smoker  . Smokeless tobacco: Never Used  Vaping Use  . Vaping Use: Never used  Substance Use Topics  . Alcohol use: No  . Drug use: No    Allergies:  Allergies  Allergen Reactions  . Shellfish Allergy     Medications Prior to Admission  Medication Sig Dispense Refill Last Dose  . aspirin EC 81 MG tablet Take 1 tablet (81 mg total) by mouth daily. 60 tablet 2 01/14/2021 at Unknown time  . Prenatal-Fe Fum-Methf-FA w/o A (VITAFOL-NANO) 18-0.6-0.4 MG TABS Take 1  tablet by mouth daily. 30 tablet 11 01/14/2021 at Unknown time  . Blood Pressure Monitoring (BLOOD PRESSURE KIT) DEVI 1 kit by Does not apply route once a week. Check Blood Pressure regularly and record readings into the [employer] App.  Large Cuff.  DX O90.0 1 each 0   . docusate sodium (COLACE) 100 MG capsule Take 1 capsule (100 mg total) by mouth 2 (two) times daily. 30 capsule 3   . Doxylamine-Pyridoxine (DICLEGIS) 10-10 MG TBEC Take 2 tablets at bedtime and one in the morning and one in the afternoon as needed for nausea. (Patient not taking: No sig reported) 60 tablet 2     Review of Systems  Constitutional: Negative for chills and fever.  Respiratory: Negative for cough and shortness of breath.   Gastrointestinal: Positive for abdominal pain. Negative for constipation, diarrhea, nausea and vomiting.  Genitourinary: Negative for difficulty urinating, dysuria, vaginal bleeding and vaginal discharge.  Musculoskeletal: Positive for back pain.  Neurological: Negative for dizziness, light-headedness and headaches.   Physical Exam   Blood pressure (!) 110/54,  pulse (!) 105, temperature 98 F (36.7 C), resp. rate 18, weight 107.3 kg, last menstrual period 05/29/2020.  Physical Exam Vitals reviewed. Exam conducted with a chaperone present.  Constitutional:      Appearance: Normal appearance.  HENT:     Head: Normocephalic and atraumatic.  Eyes:     Conjunctiva/sclera: Conjunctivae normal.  Cardiovascular:     Rate and Rhythm: Normal rate.  Abdominal:     General: Bowel sounds are normal.     Tenderness: There is no abdominal tenderness.     Comments: Gravid, Appears AGA  Genitourinary:    Rectum: No tenderness, external hemorrhoid or internal hemorrhoid. Normal anal tone.     Comments: Rectal Exam Negative Musculoskeletal:        General: No swelling or tenderness. Normal range of motion.     Cervical back: Normal range of motion.     Right hip: Normal range of motion.     Left  hip: Normal range of motion.     Right lower leg: Edema present.     Left lower leg: Edema present.     Comments: No limitations in ROM.  No pain or tenderness reported with ROM exercises.   Skin:    General: Skin is warm and dry.  Neurological:     Mental Status: She is alert and oriented to person, place, and time.  Psychiatric:        Mood and Affect: Mood normal.        Behavior: Behavior normal.        Thought Content: Thought content normal.     Fetal Assessment 135 bpm, Mod Var, -Decels, +Accels Toco: No ctx graphed  MAU Course   Results for orders placed or performed during the hospital encounter of 01/15/21 (from the past 24 hour(s))  Urinalysis, Routine w reflex microscopic Urine, Clean Catch     Status: None   Collection Time: 01/15/21 11:42 PM  Result Value Ref Range   Color, Urine YELLOW YELLOW   APPearance CLEAR CLEAR   Specific Gravity, Urine 1.011 1.005 - 1.030   pH 6.0 5.0 - 8.0   Glucose, UA NEGATIVE NEGATIVE mg/dL   Hgb urine dipstick NEGATIVE NEGATIVE   Bilirubin Urine NEGATIVE NEGATIVE   Ketones, ur NEGATIVE NEGATIVE mg/dL   Protein, ur NEGATIVE NEGATIVE mg/dL   Nitrite NEGATIVE NEGATIVE   Leukocytes,Ua NEGATIVE NEGATIVE   No results found.  MDM PE Labs: UA EFM Assessment and Plan  20 year old G1P0  SIUP at 30.2weeks Cat I FT Sciatica Pain  -POC reviewed. -Informed that complaint is c/w sciatica pain. -Informed that rectal exam could be performed to rule out hemorrhoids both external and internal.  Patient agreeable. -Exam performed and findings discussed. -Discussed interventions for improving sciatica pain.  Reviewed when to contact office for referral for PT. -Patient offered and declines pain medication at current. -Reassured that fetal movement changes is not directly related to the pain she is experiencing. -Patient without further questions or concerns. -Instructed to keep next appt as scheduled: Feb 25th. -Encouraged to call or  return to MAU if symptoms worsen or with the onset of new symptoms. -Discharged to home in stable condition.  Maryann Conners MSN, CNM 01/16/2021, 12:26 AM

## 2021-01-21 ENCOUNTER — Ambulatory Visit: Payer: Medicaid Other | Attending: Obstetrics and Gynecology

## 2021-01-21 ENCOUNTER — Other Ambulatory Visit: Payer: Self-pay

## 2021-01-21 ENCOUNTER — Ambulatory Visit: Payer: Medicaid Other | Admitting: *Deleted

## 2021-01-21 ENCOUNTER — Encounter: Payer: Self-pay | Admitting: *Deleted

## 2021-01-21 DIAGNOSIS — E669 Obesity, unspecified: Secondary | ICD-10-CM

## 2021-01-21 DIAGNOSIS — Z148 Genetic carrier of other disease: Secondary | ICD-10-CM | POA: Diagnosis not present

## 2021-01-21 DIAGNOSIS — Z3A31 31 weeks gestation of pregnancy: Secondary | ICD-10-CM

## 2021-01-21 DIAGNOSIS — O99212 Obesity complicating pregnancy, second trimester: Secondary | ICD-10-CM | POA: Diagnosis not present

## 2021-01-21 DIAGNOSIS — Z362 Encounter for other antenatal screening follow-up: Secondary | ICD-10-CM | POA: Diagnosis not present

## 2021-01-22 ENCOUNTER — Ambulatory Visit (INDEPENDENT_AMBULATORY_CARE_PROVIDER_SITE_OTHER): Payer: Medicaid Other | Admitting: Obstetrics and Gynecology

## 2021-01-22 ENCOUNTER — Other Ambulatory Visit: Payer: Self-pay

## 2021-01-22 VITALS — BP 113/66 | Wt 235.0 lb

## 2021-01-22 DIAGNOSIS — Z3A31 31 weeks gestation of pregnancy: Secondary | ICD-10-CM

## 2021-01-22 DIAGNOSIS — Z3403 Encounter for supervision of normal first pregnancy, third trimester: Secondary | ICD-10-CM

## 2021-01-22 NOTE — Progress Notes (Signed)
Patient presents for ROB. Patient has no concerns today. 

## 2021-01-22 NOTE — Progress Notes (Signed)
   PRENATAL VISIT NOTE  Subjective:  Michelle Carr is a 20 y.o. G1P0 at [redacted]w[redacted]d being seen today for ongoing prenatal care.  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  Patient reports no complaints.  Contractions: Not present. Vag. Bleeding: None.  Movement: Present. Denies leaking of fluid.   Patient is in nursing school.   The following portions of the patient's history were reviewed and updated as appropriate: allergies, current medications, past family history, past medical history, past social history, past surgical history and problem list.   Objective:   Vitals:   01/22/21 0955  BP: 113/66  Weight: 235 lb (106.6 kg)    Fetal Status: Fetal Heart Rate (bpm): 135   Movement: Present     General:  Alert, oriented and cooperative. Patient is in no acute distress.  Skin: Skin is warm and dry. No rash noted.   Cardiovascular: Normal heart rate noted  Respiratory: Normal respiratory effort, no problems with respiration noted  Abdomen: Soft, gravid, appropriate for gestational age.  Pain/Pressure: Present     Pelvic: Cervical exam deferred        Extremities: Normal range of motion.  Edema: Mild pitting, slight indentation  Mental Status: Normal mood and affect. Normal behavior. Normal judgment and thought content.   Assessment and Plan:  Pregnancy: G1P0 at [redacted]w[redacted]d 1. Encounter for supervision of normal first pregnancy in third trimester -doing well. Baby cephalic on last [place]. Discussed w patient.   2. [redacted] weeks gestation of pregnancy   Preterm labor symptoms and general obstetric precautions including but not limited to vaginal bleeding, contractions, leaking of fluid and fetal movement were reviewed in detail with the patient. Please refer to After Visit Summary for other  counseling recommendations.   Return in about 2 weeks (around 02/05/2021).  Future Appointments  Date Time Provider Department Center  02/08/2021 10:30 AM Constant, Gigi Gin, MD [facility] None    Gita Kudo, MD

## 2021-02-08 ENCOUNTER — Ambulatory Visit (INDEPENDENT_AMBULATORY_CARE_PROVIDER_SITE_OTHER): Payer: Medicaid Other | Admitting: Obstetrics and Gynecology

## 2021-02-08 ENCOUNTER — Encounter: Payer: Self-pay | Admitting: Obstetrics and Gynecology

## 2021-02-08 ENCOUNTER — Other Ambulatory Visit: Payer: Self-pay

## 2021-02-08 VITALS — BP 108/72 | HR 88 | Wt 238.0 lb

## 2021-02-08 DIAGNOSIS — Z3403 Encounter for supervision of normal first pregnancy, third trimester: Secondary | ICD-10-CM

## 2021-02-08 DIAGNOSIS — Z148 Genetic carrier of other disease: Secondary | ICD-10-CM

## 2021-02-08 NOTE — Patient Instructions (Signed)
AREA PEDIATRIC/FAMILY PRACTICE PHYSICIANS  [place] ([address]) . [facility] o Chambliss, MD; Eniola, MD; Hale, MD; Hensel, MD; McDiarmid, MD; McIntyer, MD; Neal, MD; Walden, MD o [address] [place], [place] [address] o [phone] o Mon-Fri 8:30-12:30, 1:30-5:00 o Providers come to see babies at [facility] o Accepting Medicaid . [facility] o Limited providers who accept newborns: Koirala, MD; Morrow, MD; Wolters, MD o [address], [place], [place] [address] o [phone] o Mon-Fri 8:00-5:30 o Babies seen by providers at [facility] o Does NOT accept Medicaid o Please call early in hospitalization for appointment (limited availability)  . [facility] o Mulberry, MD o [address] [place], [place] [address] o [phone] o Mon, Tue, Thur, Fri 8:30-5:00, Wed 10:00-7:00 (closed 1-2pm) o Babies seen by [facility] providers o Accepting Medicaid . [facility] - Pediatrician o Rubin, MD o [address], [place], [place] [address] o [phone] o Mon-Fri 8:30-5:00, Sat 8:30-12:00 o Provider comes to see babies at [facility] o Accepting Medicaid o Must have been referred from current patients or contacted office prior to delivery . [facility] ([facility]) o Brown, MD; Chandler, MD; Ettefagh, MD; Grant, MD; Lester, MD; McCormick, MD; McQueen, MD; Prose, MD; Simha, MD; Stanley, MD; Stryffeler, NP; Tebben, NP o [address], [place], [place] [address] o [phone] o Mon, Tue, Thur, Fri 8:30-5:30, Wed 9:30-5:30, Sat 8:30-12:30 o Babies seen by [facility] providers o Accepting Medicaid o Only accepting infants of first-time parents or siblings of current patients o Hospital discharge coordinator will make follow-up appointment . Jack Amos o [address],  [place], [place]  [address] o [phone]   Fax - [phone] . [facility] o [address], [place], [place]  [address] o Phone - [phone]   Fax - [phone] . Shilpa Gosrani o [address], [place], [place]  [address] o [phone]  [place] ([address]) . [facility] o Bates, MD; Brassfield, MD; Cooper, Cox, MD; MD; Davis, MD; Dovico, MD; Ettefaugh, MD; Little, MD; Lowe, MD; Keiffer, MD; Melvin, MD; Sumner, MD; Williams, MD o [address], [place], [place] [address] o [phone] o Mon-Fri 8:30-5:00 (extended evenings Mon-Thur as needed), Sat-Sun 10:00-1:00 o Providers come to see babies at [facility] o Accepting Medicaid for families of first-time babies and families with all children in the household age 3 and under. Must register with office prior to making appointment (M-F only). . [facility] o Henson, NP; Knapp, MD; Lalonde, MD; Tysinger, PA o [address] [place], [place] [address] o [phone] o Mon-Fri 8:00-5:00 o Babies seen by providers at [facility] o Does NOT accept Medicaid/Commercial Insurance Only . [facility] ([facility])  o Artis, MD; Barnes, MD; Bratton, MD; Coccaro, MD; Lockett Gardner, MD; Kramer, MD; Marshall, MD; Netherton, MD; Poleto, MD; Skinner, MD o [address] [place], [place] [address] o [phone] o Mon-Fri 8:30-5:30, Sat (Oct.-Mar.) 9:00-1:00 o Babies seen by providers at [facility] o Accepting Medicaid  [place] ([address]) . [facility] o Reid, MD; Warner, MD o [address], , [place] [address] o [phone] o Mon-Fri 8:30-5:00, Sat 8:30-12:00 o Providers come to see babies at [facility] o Does NOT accept Medicaid . [facility] o Becker, PA; Hagler, MD; Scifres, PA; Sun, MD; Swayne, MD o [address],  [place], [place] [address] o [phone] o Mon-Fri 8:00-5:00 o Babies seen by providers at [facility] o Does NOT accept Medicaid o Only accepting babies of parents who are patients o Please call early in hospitalization for appointment (limited availability) . [facility] o Clark, MD; Frye, MD; Kelleher, MD; Mack, NP; Miller, MD; O'Keller, MD; Patterson, NP; Pudlo, MD; Puzio, MD; Thomas, MD; Tucker, MD; Twiselton, MD o [address], [place], [place] [address] o [phone] o Mon-Fri 8:00-5:00, Sat 9:00-12:00 o Providers come to see babies at [facility] o Does NOT accept Medicaid  [place] ([address]) . [facility] o Limited providers accepting new patients: Brake, NP; Wharton, PA o [address], [place], [place] [address] o [phone] o Mon-Fri 8:00-5:00 o Babies seen by providers at [facility] o Does NOT accept Medicaid o Only accepting babies of parents who are patients o Please call early in hospitalization for appointment (limited availability) . [facility] o Gay, MD; Quinlan, MD o [address] [place], [place] [address] o [phone] (press 1 to schedule appointment) o Mon-Fri 8:00-5:00 o Providers come to see babies at [facility] o Does NOT accept Medicaid . [facility] o Mazer, MD o [address] [place], [place] [address] o [phone] o Mon-Fri 8:30-5:00 (lunch 12:30-1:00), extended hours by appointment only Wed 5:00-6:30 o Babies seen by [facility] providers o Accepting Medicaid . [facility] o Banks, MD; Jordan, MD; Koberlein, MD o [address], [place], [place] [address] o [phone] o Mon-Fri 8:00-5:00 o Babies seen by [facility] providers o Does NOT accept Medicaid . [facility] o Parker, MD; Hunter, MD; Wallace, DO o [address] [place], [place]  [address] o [phone] o Mon-Fri 8:00-5:00 o Babies seen by [facility] providers o Does NOT accept Medicaid . [facility] o Brandon, PA; Brecken, PA; Christy, NP; Dees, MD; DeClaire, MD; DeWeese, MD; Hansen, NP; Mills, NP; Parrish, NP; Smoot, NP; Summer, MD; Vapne, MD o [address] [place], [place] [address] o [phone] o Mon-Fri 8:30-5:00, Sat 10:00-1:00 o Providers come to see babies at [facility] o Does NOT accept Medicaid o Free prenatal information session Tuesdays at 4:45pm . [facility] o Bouska, MD; Gordon, PA; Jeffery, PA; Weber, PA o [address] [place] [address] o [phone] o Mon-Fri 7:30-5:30 o Babies seen by [facility] providers . [facility] o [address], [place], [place]  [address] o [phone]   Fax - [phone]  [place] ([address] & [address]) . [facility] o Reese, MD o [address] [place], [place] [address] o [phone] o Mon-Thur 8:00-6:00 o Providers come to see babies at [facility] o Accepting Medicaid . [facility] o Anderson, NP; Badger, MD; Beal, PA; Spencer, PA o [address] [place], [place] [address] o [phone] o Mon-Thur 7:30-7:30, Fri 7:30-4:30 o Babies seen by [facility] providers o Accepting Medicaid . [facility] o Agbuya, MD; Klett, NP; Romgoolam, MD o [address], [place],  [address] o [phone] o Mon-Fri 8:30-5:00, Sat 8:30-12:00 o Providers come to see babies at [facility] o Accepting Medicaid o Must have "Meet & Greet" appointment at office prior to delivery . [facility] o McCord,   MD; Wallace, MD; Wood, MD o [address], [place], [place] [address] o [phone] o Mon-Wed 8:00-6:00, Thur-Fri 8:00-5:00, Sat 9:00-12:00 o Providers come to  see babies at [facility] o Does NOT accept Medicaid o Only accepting siblings of current patients . [facility]  o [address], [place], [place]  [address] o [phone]   Fax - [phone] . [facility] o [address], [place], [place]  [address] o [phone]   Fax - [phone]  [place] ([address]) . [facility] o Cirigliano, DO; Matthews, DO o [address] [place], [place] [address] o [phone] o Mon-Fri 7:00-5:00 o Babies seen by [facility] providers o Does NOT accept Medicaid . [facility] o Briscoe, MD; Howley, PA; Moreira, PA o [address], [place], [place] [address] o [phone] o Mon-Fri 8:00-5:00 o Babies seen by [facility] providers o Accepting Medicaid . [facility] o Boyd, MD; Church, PA; Jones, NP; Osborn, PA o [address], , [place] [address] o [phone] o Mon-Fri 8:00-5:00 o Babies seen by providers at [facility] o Accepting Medicaid  [place] ([address]) . [facility] [place] o Wendling, DO o [address] [place], [place] [address] o [phone] o Mon-Fri 8:00-5:00 o Babies seen by [facility] providers o Does NOT accept Medicaid o Limited availability, please call early in hospitalization to schedule follow-up . [facility] o Calderon, PA; Cummings, MD; Dillard, MD; Martin, PA; Olson, MD; VanDeven, PA o [address], [place], [place] [address] o [phone] o Mon-Fri 8:30-5:00, Sat 9:00-12:00 o Babies seen by providers at [facility] o Accepting Medicaid o Please register online then schedule online or call office o [email or web address] . [facility] o Hunter, NP; Kumar, MD; Martin Rogers, PA o [address], [place], [place] [address] o [phone] o Mon-Fri 8:00-5:00 o Babies seen by providers at [facility] o Accepting Medicaid . [facility] ([facility] o [place], MD; Kristi Fleenor, NP; West, MD o [address], [place], [place] [address] o [phone] o Mon-Fri 8:00-5:30, Sat&Sun by appointment (phones open at 8:30) o Babies seen by [facility] providers o Accepting Medicaid o Must be a first-time baby or sibling of current patient . [facility] [place]  o [address], [place], [place]  [address] o [phone]   Fax - [phone]  [place] ([address]) . [facility] o Brown, PA; Cowen, PA; Rice, MD; Helton, PA; Spry, MD o [address] [place], [place] [address] o [phone] o Mon-Thur 8:00-7:00, Fri 8:00-5:00, Sat 8:00-12:00, Sun 9:00-12:00 o Babies seen by [facility] providers o Accepting Medicaid . [facility] o Coe-Goins, MD; Marshall, MD; Pierre-Louis, MD o [address] [place] [address], [place], [place] [address] o [phone] o Mon-Thur 8:00-5:00 o Babies seen by providers at [facility] o Accepting Medicaid . [facility] o Bratton, MD; Coe-Goins, MD; Hayes, MD; Lewis, MD; List, MD; Lott, MD; Marshall, MD; Moran, MD; O'Neal, MD; Pierre-Louis, MD; Pitonzo, MD; Scholer, MD; Spangle, MD o [address] [place], [place]   [address] o [phone] o Mon-Fri 8:00-5:30, Sat (Oct.-Mar.) 9:00-1:00 o Babies seen by providers at [facility] o Accepting Medicaid o Must fill out new patient packet, available online at [email or web address] . [facility] ([facility] o Friddle, NP; Harris, NP; Kelly, NP; Logan, MD;  Melvin, PA; Poth, MD; Ramadoss, MD; Stanton, NP o [address], [place], [place] [address] o [phone] o Mon-Thur 8:00-5:30, Fri 8:00-5:00 o Babies seen by providers at [facility] o Accepting Medicaid  [place] ([address]) . [facility] o Dixon, PA; Davison, MD; Pickard, MD; Tapia, PA o [address], [place], [place] [address] o [phone] o Mon-Fri 8:00-5:00 o Babies seen by providers at [facility] o Accepting Medicaid   [place] ([address]) . [facility] [place] o Masneri, DO; Meyers, MD; Nelson, PA o [address], [place], [place] [address] o [phone] o Mon-Fri 8:00-5:00 o Babies seen by providers at [facility] o Does NOT accept Medicaid o Limited appointment availability, please call early in hospitalization  . [facility] [place] o Kunedd, DO; McGowen, MD o [address], [place], [place] [address] o [phone] o Mon-Fri 8:00-5:00 o Babies seen by [facility] providers o Does NOT accept Medicaid . [facility] [place] o Cameron, MD; MacDonald, MD; Michaels, PA; Nayak, MD o [address] [place] [address], [place], [place] [address] o [phone] o Mon-Fri 8:00-5:00 o After hours clinic ([address] [place], [place] [address]) [phone] Mon-Fri 5:00-8:00, Sat 12:00-6:00, Sun 10:00-4:00 o Babies seen by [facility] providers o Accepting Medicaid . [facility] [place] o [address], [place], [place]  [address] o [phone]   Fax - [phone]  [place] ([address]) . [facility] [place] [facility] o Andy, MD o [address], [place], [place] [address] o [phone] o Mon-Fri 8:00-5:00 o Babies seen by [facility] providers o Does NOT accept Medicaid . [facility] [place] ([facility] [place]) o Eksir, MD o [address], [place], [place]  [address] o [phone] o Mon-Thur 8:00-7:00, Fri 8:00-5:00, Sat 8:00-12:00 o Babies seen by providers at [facility] o Accepting Medicaid - but does not have vaccinations in office (must be received elsewhere) o Limited availability, please call early in hospitalization  [place] ([address]) . [facility]  o Charlene Flemming, MD o [address], [place] [address] o [phone]  Fax [phone]   

## 2021-02-08 NOTE — Progress Notes (Signed)
Patient presents for ROB. Patient has no concerns today. 

## 2021-02-08 NOTE — Progress Notes (Signed)
   PRENATAL VISIT NOTE  Subjective:  Michelle Carr is a 20 y.o. G1P0 at [redacted]w[redacted]d being seen today for ongoing prenatal care.  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  Patient reports no complaints.  Contractions: Not present. Vag. Bleeding: None.  Movement: Present. Denies leaking of fluid.   The following portions of the patient's history were reviewed and updated as appropriate: allergies, current medications, past family history, past medical history, past social history, past surgical history and problem list.   Objective:   Vitals:   02/08/21 1034  BP: 108/72  Pulse: 88  Weight: 238 lb (108 kg)    Fetal Status: Fetal Heart Rate (bpm): 140 Fundal Height: 32 cm Movement: Present     General:  Alert, oriented and cooperative. Patient is in no acute distress.  Skin: Skin is warm and dry. No rash noted.   Cardiovascular: Normal heart rate noted  Respiratory: Normal respiratory effort, no problems with respiration noted  Abdomen: Soft, gravid, appropriate for gestational age.  Pain/Pressure: Absent     Pelvic: Cervical exam deferred        Extremities: Normal range of motion.  Edema: Trace  Mental Status: Normal mood and affect. Normal behavior. Normal judgment and thought content.   Assessment and Plan:  Pregnancy: G1P0 at [redacted]w[redacted]d 1. Encounter for supervision of normal first pregnancy in third trimester Patient is doing well without complaints Patient undecided on pediatrician- list provided Patient plans pills for contraception  2. 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 Please  refer to After Visit Summary for other counseling recommendations.   Return in about 2 weeks (around 02/22/2021) for in person, ROB, Low risk.  No future appointments.  Catalina Antigua, MD

## 2021-02-24 ENCOUNTER — Other Ambulatory Visit: Payer: Self-pay

## 2021-02-24 ENCOUNTER — Ambulatory Visit (INDEPENDENT_AMBULATORY_CARE_PROVIDER_SITE_OTHER): Payer: Medicaid Other | Admitting: Women's Health

## 2021-02-24 ENCOUNTER — Encounter: Payer: Self-pay | Admitting: Women's Health

## 2021-02-24 VITALS — BP 105/67 | HR 87 | Wt 240.0 lb

## 2021-02-24 DIAGNOSIS — Z148 Genetic carrier of other disease: Secondary | ICD-10-CM

## 2021-02-24 DIAGNOSIS — Z3A35 35 weeks gestation of pregnancy: Secondary | ICD-10-CM

## 2021-02-24 DIAGNOSIS — Z3403 Encounter for supervision of normal first pregnancy, third trimester: Secondary | ICD-10-CM

## 2021-02-24 DIAGNOSIS — Z6836 Body mass index (BMI) 36.0-36.9, adult: Secondary | ICD-10-CM

## 2021-02-24 DIAGNOSIS — E6609 Other obesity due to excess calories: Secondary | ICD-10-CM

## 2021-02-24 NOTE — Progress Notes (Signed)
ROB [redacted]w[redacted]d  CC: None

## 2021-02-24 NOTE — Progress Notes (Signed)
Subjective:  Michelle Carr is a 20 y.o. G1P0 at [redacted]w[redacted]d being seen today for ongoing prenatal care.  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  Patient reports no complaints.  Contractions: Not present. Vag. Bleeding: None.  Movement: Present. Denies leaking of fluid.   The following portions of the patient's history were reviewed and updated as appropriate: allergies, current medications, past family history, past medical history, past social history, past surgical history and problem list. Problem list updated.  Objective:   Vitals:   02/24/21 1034  BP: 105/67  Pulse: 87  Weight: 240 lb (108.9 kg)    Fetal Status: Fetal Heart Rate (bpm): 156 Fundal Height: 34 cm Movement: Present  Presentation: Vertex  General:  Alert, oriented and cooperative. Patient is in no acute distress.  Skin: Skin is warm and dry. No rash noted.   Cardiovascular: Normal heart rate noted  Respiratory: Normal respiratory effort, no problems with respiration noted  Abdomen: Soft, gravid, appropriate for gestational age. Pain/Pressure: Present     Pelvic: Vag. Bleeding: None     Cervical exam deferred        Extremities: Normal range of motion.  Edema: Trace  Mental Status: Normal mood and affect. Normal behavior. Normal judgment and thought content.   Urinalysis:      Assessment and Plan:  Pregnancy: G1P0 at [redacted]w[redacted]d  1. Encounter for supervision of normal first pregnancy in third trimester -GBS next visit -discussed contraception, info given, recommended IP option -peds info given  2. 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 Class 2 obesity due to excess calories without serious comorbidity with body  mass index (BMI) of 36.0 to 36.9 in adult -last growth 01/21/2021, no additional growth scans recommended per MFM -FH today 34  4. [redacted] weeks gestation of pregnancy  Preterm labor symptoms and general obstetric precautions including but not limited to vaginal bleeding, contractions, leaking of fluid and fetal movement were reviewed in detail with the patient. I discussed the assessment and treatment plan with the patient. The patient was provided an opportunity to ask questions and all were answered. The patient agreed with the plan and demonstrated an understanding of the instructions. The patient was advised to call back or seek an in-person office evaluation/go to [facility] at [facility] for any urgent or concerning symptoms. Please refer to After Visit Summary for other counseling recommendations.  Return in about 1 week (around 03/03/2021) for in-person LOB/APP OK/GBS/cultures.   Lorain Fettes, Odie Sera, NP

## 2021-02-24 NOTE — Patient Instructions (Addendum)
Maternity Assessment Unit (MAU)  The Maternity Assessment Unit (MAU) is located at the [facility] at [facility]. The address is: [address], [place], [place], [place] [address]. Please see map below for additional directions.    The Maternity Assessment Unit is designed to help you during your pregnancy, and for up to 6 weeks after delivery, with any pregnancy- or postpartum-related emergencies, if you think you are in labor, or if your water has broken. For example, if you experience nausea and vomiting, vaginal bleeding, severe abdominal or pelvic pain, elevated blood pressure or other problems related to your pregnancy or postpartum time, please come to the Maternity Assessment Unit for assistance.        Group B Streptococcus Test During Pregnancy Why am I having this test? Routine testing, also called screening, for group B streptococcus (GBS) is recommended for all pregnant women between the 36th and 37th week of pregnancy. GBS is a type of bacteria that can be passed from mother to baby during childbirth. Screening will help guide whether or not you will need treatment during labor and delivery to prevent complications such as:  An infection in your uterus during labor.  An infection in your uterus after delivery.  A serious infection in your baby after delivery, such as pneumonia, meningitis, or sepsis. GBS screening is not often done before 36 weeks of pregnancy unless you go into labor prematurely. What happens if I have group B streptococcus? If testing shows that you have GBS, your health care provider will recommend treatment with IV antibiotics during labor and delivery. This treatment significantly decreases the risk of complications for you and your baby. If you have a planned C-section and you have GBS, you may not need to be treated with antibiotics because GBS is usually passed to babies after labor starts and your water breaks. If  you are in labor or your water breaks before your C-section, it is possible for GBS to get into your uterus and be passed to your baby, so you might need treatment. Is there a chance I may not need to be tested? You may not need to be tested for GBS if:  You have a urine test that shows GBS before 36 to 37 weeks.  You had a baby with GBS infection after a previous delivery. In these cases, you will automatically be treated for GBS during labor and delivery. What is being tested? This test is done to check if you have group B streptococcus in your vagina or rectum. What kind of sample is taken? To collect samples for this test, your health care provider will swab your vagina and rectum with a cotton swab. The sample is then sent to the lab to see if GBS is present. What happens during the test?  You will remove your clothing from the waist down.  You will lie down on an exam table in the same position as you would for a pelvic exam.  Your health care provider will swab your vagina and rectum to collect samples for a culture test.  You will be able to go home after the test and do all your usual activities.   How are the results reported? The test results are reported as positive or negative. What do the results mean?  A positive test means you are at risk for passing GBS to your baby during labor and delivery. Your health care provider will recommend that you are treated with an  IV antibiotic during labor and delivery.  A negative test means you are at very low risk of passing GBS to your baby. There is still a low risk of passing GBS to your baby because sometimes test results may report that you do not have a condition when you do (false-negative result) or there is a chance that you may become infected with GBS after the test is done. You most likely will not need to be treated with an antibiotic during labor and delivery. Talk with your health care provider about what your results  mean. Questions to ask your health care provider Ask your health care provider, or the department that is doing the test:  When will my results be ready?  How will I get my results?  What are my treatment options? Summary  Routine testing (screening) for group B streptococcus (GBS) is recommended for all pregnant women between the 36th and 37th week of pregnancy.  GBS is a type of bacteria that can be passed from mother to baby during childbirth.  If testing shows that you have GBS, your health care provider will recommend that you are treated with IV antibiotics during labor and delivery. This treatment almost always prevents infection in newborns. This information is not intended to replace advice given to you by your health care provider. Make sure you discuss any questions you have with your health care provider. Document Revised: 09/15/2020 Document Reviewed: 12/12/2018 Elsevier Patient Education  2021 [employer].        Contraception Choices - WWW.[email or web address].[facility] Contraception, also called birth control, refers to methods or devices that prevent pregnancy. Hormonal methods Contraceptive implant A contraceptive implant is a thin, plastic tube that contains a hormone that prevents pregnancy. It is different from an intrauterine device ([name]). It is inserted into the upper part of the arm by a health care provider. Implants can be effective for up to 3 years. Progestin-only injections Progestin-only injections are injections of progestin, a synthetic form of the hormone progesterone. They are given every 3 months by a health care provider. Birth control pills Birth control pills are pills that contain hormones that prevent pregnancy. They must be taken once a day, preferably at the same time each day. A prescription is needed to use this method of contraception. Birth control patch The birth control patch contains hormones that prevent pregnancy. It is placed on the skin and  must be changed once a week for three weeks and removed on the fourth week. A prescription is needed to use this method of contraception. Vaginal ring A vaginal ring contains hormones that prevent pregnancy. It is placed in the vagina for three weeks and removed on the fourth week. After that, the process is repeated with a new ring. A prescription is needed to use this method of contraception. Emergency contraceptive Emergency contraceptives prevent pregnancy after unprotected sex. They come in pill form and can be taken up to 5 days after sex. They work best the sooner they are taken after having sex. Most emergency contraceptives are available without a prescription. This method should not be used as your only form of birth control.   Barrier methods Female condom A female condom is a thin sheath that is worn over the penis during sex. Condoms keep sperm from going inside a woman's body. They can be used with a sperm-killing substance (spermicide) to increase their effectiveness. They should be thrown away after one use. Female condom A female condom is a soft, loose-fitting sheath  that is put into the vagina before sex. The condom keeps sperm from going inside a woman's body. They should be thrown away after one use. Diaphragm A diaphragm is a soft, dome-shaped barrier. It is inserted into the vagina before sex, along with a spermicide. The diaphragm blocks sperm from entering the uterus, and the spermicide kills sperm. A diaphragm should be left in the vagina for 6-8 hours after sex and removed within 24 hours. A diaphragm is prescribed and fitted by a health care provider. A diaphragm should be replaced every 1-2 years, after giving birth, after gaining more than 15 lb (6.8 kg), and after pelvic surgery. Cervical cap A cervical cap is a round, soft latex or plastic cup that fits over the cervix. It is inserted into the vagina before sex, along with spermicide. It blocks sperm from entering the  uterus. The cap should be left in place for 6-8 hours after sex and removed within 48 hours. A cervical cap must be prescribed and fitted by a health care provider. It should be replaced every 2 years. Sponge A sponge is a soft, circular piece of polyurethane foam with spermicide in it. The sponge helps block sperm from entering the uterus, and the spermicide kills sperm. To use it, you make it wet and then insert it into the vagina. It should be inserted before sex, left in for at least 6 hours after sex, and removed and thrown away within 30 hours. Spermicides Spermicides are chemicals that kill or block sperm from entering the cervix and uterus. They can come as a cream, jelly, suppository, foam, or tablet. A spermicide should be inserted into the vagina with an applicator at least 10-15 minutes before sex to allow time for it to work. The process must be repeated every time you have sex. Spermicides do not require a prescription.   Intrauterine contraception Intrauterine device ([name]) An [name] is a T-shaped device that is put in a woman's uterus. There are two types:  Hormone [name].This type contains progestin, a synthetic form of the hormone progesterone. This type can stay in place for 3-5 years.  Copper [name].This type is wrapped in copper wire. It can stay in place for 10 years. Permanent methods of contraception Female tubal ligation In this method, a woman's fallopian tubes are sealed, tied, or blocked during surgery to prevent eggs from traveling to the uterus. Hysteroscopic sterilization In this method, a small, flexible insert is placed into each fallopian tube. The inserts cause scar tissue to form in the fallopian tubes and block them, so sperm cannot reach an egg. The procedure takes about 3 months to be effective. Another form of birth control must be used during those 3 months. Female sterilization This is a procedure to tie off the tubes that carry sperm (vasectomy). After the procedure,  the man can still ejaculate fluid (semen). Another form of birth control must be used for 3 months after the procedure. Natural planning methods Natural family planning In this method, a couple does not have sex on days when the woman could become pregnant. Calendar method In this method, the woman keeps track of the length of each menstrual cycle, identifies the days when pregnancy can happen, and does not have sex on those days. Ovulation method In this method, a couple avoids sex during ovulation. Symptothermal method This method involves not having sex during ovulation. The woman typically checks for ovulation by watching changes in her temperature and in the consistency of cervical mucus. Post-ovulation  method In this method, a couple waits to have sex until after ovulation. Where to find more information  Centers for Disease Control and Prevention: [employer]Ex[employer].com.br Summary  Contraception, also called birth control, refers to methods or devices that prevent pregnancy.  Hormonal methods of contraception include implants, injections, pills, patches, vaginal rings, and emergency contraceptives.  Barrier methods of contraception can include female condoms, female condoms, diaphragms, cervical caps, sponges, and spermicides.  There are two types of IUDs (intrauterine devices). An [name] can be put in a woman's uterus to prevent pregnancy for 3-5 years.  Permanent sterilization can be done through a procedure for males and females. Natural family planning methods involve nothaving sex on days when the woman could become pregnant. This information is not intended to replace advice given to you by your health care provider. Make sure you discuss any questions you have with your health care provider. Document Revised: 04/20/2020 Document Reviewed: 04/20/2020 Elsevier Patient Education  2021 Elsevier Inc.       AREA PEDIATRIC/FAMILY PRACTICE PHYSICIANS  [facility] [address] [place], [place] [address] Phone - [phone]   Fax - [phone]  JACK AMOS [address] [place], [place]  [address] Phone - [phone]   Fax - [phone]  [facility] [address] [place], [place]  [address] Phone - [phone]   Fax - [phone]  [facility] [address] [place], [place]  [address] Phone - [phone]   Fax - [phone]  [facility] [address] [place], [place]  [address] Phone - [phone]   Fax - [phone]  [facility] [address] [place], [place]  [address] Phone - [phone]   Fax - [phone]  [facility] [address] [place], [place]  [address] Phone - [phone]   Fax - [phone]  [facility] AT [facility] [address] [place], [address] [place], [place]  [address] Phone - [phone]   Fax - [phone]  [facility] AT [facility] [address] [place], [place]  [address] Phone - [phone]   Fax - [phone] [facility] AT [place] [address] [place], [place]  [address] Phone - [phone]   Fax - [phone]  [facility] AT [facility] [address] [place], [place]  [address] Phone - [phone]   Fax - [phone]  [facility] [address], [address] [place], [place]  [address] Phone - [phone]   Fax - [phone]  [facility] [place] [address] [place], [place]  [address] Phone - [phone]   Fax - [phone]  [facility] [address] [place], [place]  [address] Phone - [phone]  [facility] [address] [place], [place]  [address] Phone - [phone]   Fax - [phone]  [facility] [address] [place], [place]  [address] Phone - [phone]   Fax -  [phone]  [facility] [address] [place], [place]  [address] Phone - [phone]   Fax - [phone]   F[facility]ILY MEDICINE [address] [place], [place]  [address] Phone - [phone]   Fax - [phone]   [facility] [address] [place], [place]  [address] Phone - [phone]   Fax - [phone]  [facility] [address] [place], [place]  [address] Phone - [phone]   Fax - [phone]  DAVID RUBIN [address] [place], [place]  [address] Phone - [phone]   Fax - [phone]  [facility] [address] [place], [place]  [address] Phone - [phone]   Fax - [phone]  [place] [facility] Alita Chyle [address] [place], [place]  [address] Phone - [phone]   Fax - [phone] Gerarda Fraction [address] [place], [place]  [address] Phone - [phone]   Fax - [phone]  [facility] [address] [place], [place]  [address] Phone - [phone]   Fax - [phone]  [facility] - [place] [address] [place], [place]  [address] Phone - [phone]   Fax - [phone]          Preterm Labor The normal length of a pregnancy is 39-41 weeks. Preterm labor is when labor starts before 37 completed weeks of pregnancy. Babies who are born prematurely and survive may not be fully developed and may be at an increased risk for long-term problems such as cerebral palsy, developmental delays, and vision and hearing problems. Babies who are born too early may have problems soon after birth. Problems may include regulating blood sugar, body temperature, heart rate, and breathing rate. These babies often have trouble with feeding. The risk of having problems is highest for babies who are born before 34 weeks of pregnancy. What are the causes? The exact cause of this  condition is not known. What increases the risk? You are more likely to have preterm labor if you have certain risk factors that relate to your medical history, problems with present and past pregnancies, and lifestyle factors. Medical history  You have abnormalities of the uterus, including a short cervix.  You have STIs (sexually transmitted infections), or other infections of the urinary tract and the vagina.  You have chronic illnesses, such as blood clotting problems, diabetes, or high blood pressure.  You are overweight or underweight. Present and past pregnancies  You have had preterm labor before.  You are pregnant with twins or other multiples.  You have been diagnosed with a condition in which the placenta covers your cervix (placenta previa).  You waited less than 6 months between giving birth and becoming pregnant again.  Your unborn baby has some abnormalities.  You have vaginal bleeding during pregnancy.  You became pregnant through in vitro fertilization (IVF). Lifestyle and environmental factors  You use tobacco products.  You drink alcohol.  You use street drugs.  You have stress and no social support.  You experience domestic violence.  You are exposed to certain chemicals or environmental pollutants. Other factors  You are younger than age 35 or older than age 66. What are the signs or symptoms? Symptoms of this condition include:  Cramps similar to those that can happen during a menstrual period. The cramps may happen with diarrhea.  Pain in the abdomen or lower back.  Regular contractions that may feel like tightening of the abdomen.  A feeling of increased pressure in the pelvis.  Increased watery or bloody mucus discharge from the vagina.  Water breaking (ruptured amniotic sac). How is this diagnosed? This condition is diagnosed based on:  Your medical history and a physical exam.  A pelvic exam.  An ultrasound.  Monitoring your  uterus for contractions.  Other tests, including: ? A swab of the cervix to check for a chemical called  fetal fibronectin. ? Urine tests. How is this treated? Treatment for this condition depends on the length of your pregnancy, your condition, and the health of your baby. Treatment may include:  Taking medicines, such as: ? Hormone medicines. These may be given early in pregnancy to help support the pregnancy. ? Medicines to stop contractions. ? Medicines to help mature the baby's lungs. These may be prescribed if the risk of delivery is high. ? Medicines to prevent your baby from developing cerebral palsy.  Bed rest. If the labor happens before 34 weeks of pregnancy, you may need to stay in the hospital.  Delivery of the baby. Follow these instructions at home:  Do not use any products that contain nicotine or tobacco, such as cigarettes, e-cigarettes, and chewing tobacco. If you need help quitting, ask your health care provider.  Do not drink alcohol.  Take over-the-counter and prescription medicines only as told by your health care provider.  Rest as told by your health care provider.  Return to your normal activities as told by your health care provider. Ask your health care provider what activities are safe for you.  Keep all follow-up visits as told by your health care provider. This is important.   How is this prevented? To increase your chance of having a full-term pregnancy:  Do not use street drugs or medicines that have not been prescribed to you during your pregnancy.  Talk with your health care provider before taking any herbal supplements, even if you have been taking them regularly.  Make sure you gain a healthy amount of weight during your pregnancy.  Watch for infection. If you think that you might have an infection, get it checked right away. Symptoms of infection may include: ? Fever. ? Abnormal vaginal discharge or discharge that smells bad. ? Pain or  burning with urination. ? Needing to urinate urgently. ? Frequently urinating or passing small amounts of urine frequently. ? Blood in your urine. ? Urine that smells bad or unusual.  Tell your health care provider if you have had preterm labor before. Contact a health care provider if:  You think you are going into preterm labor.  You have signs or symptoms of preterm labor.  You have symptoms of infection. Get help right away if:  You are having regular, painful contractions every 5 minutes or less.  Your water breaks. Summary  Preterm labor is labor that starts before you reach 37 weeks of pregnancy.  Delivering your baby early increases your baby's risk of developing lifelong problems.  The exact cause of preterm labor is unknown. However, having an abnormal uterus, an STI (sexually transmitted infection), or vaginal bleeding during pregnancy increases your risk for preterm labor.  Keep all follow-up visits as told by your health care provider. This is important.  Contact a health care provider if you have signs or symptoms of preterm labor. This information is not intended to replace advice given to you by your health care provider. Make sure you discuss any questions you have with your health care provider. Document Revised: 12/17/2019 Document Reviewed: 12/17/2019 Elsevier Patient Education  2021 [employer]

## 2021-02-26 LAB — URINE CULTURE

## 2021-03-05 ENCOUNTER — Ambulatory Visit (INDEPENDENT_AMBULATORY_CARE_PROVIDER_SITE_OTHER): Payer: Medicaid Other | Admitting: Obstetrics and Gynecology

## 2021-03-05 ENCOUNTER — Other Ambulatory Visit: Payer: Self-pay

## 2021-03-05 ENCOUNTER — Encounter: Payer: Self-pay | Admitting: Obstetrics and Gynecology

## 2021-03-05 ENCOUNTER — Other Ambulatory Visit (HOSPITAL_COMMUNITY)
Admission: RE | Admit: 2021-03-05 | Discharge: 2021-03-05 | Disposition: A | Discharge status: Home / Self Care | Payer: Medicaid Other | Source: Ambulatory Visit | Attending: Obstetrics and Gynecology | Admitting: Obstetrics and Gynecology

## 2021-03-05 DIAGNOSIS — Z3403 Encounter for supervision of normal first pregnancy, third trimester: Secondary | ICD-10-CM

## 2021-03-05 NOTE — Progress Notes (Signed)
Pt reports fetal movement, denies pain.  

## 2021-03-05 NOTE — Progress Notes (Signed)
Subjective:  Michelle Carr is a 20 y.o. G1P0 at [redacted]w[redacted]d being seen today for ongoing prenatal care.  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  Patient reports general discomforts of pregnancy.  Contractions: Not present. Vag. Bleeding: None.  Movement: Present. Denies leaking of fluid.   The following portions of the patient's history were reviewed and updated as appropriate: allergies, current medications, past family history, past medical history, past social history, past surgical history and problem list. Problem list updated.  Objective:   Vitals:   03/05/21 0947  BP: 119/75  Pulse: 82  Weight: 245 lb 14.4 oz (111.5 kg)    Fetal Status: Fetal Heart Rate (bpm): 135   Movement: Present     General:  Alert, oriented and cooperative. Patient is in no acute distress.  Skin: Skin is warm and dry. No rash noted.   Cardiovascular: Normal heart rate noted  Respiratory: Normal respiratory effort, no problems with respiration noted  Abdomen: Soft, gravid, appropriate for gestational age. Pain/Pressure: Absent     Pelvic:  Cervical exam performed        Extremities: Normal range of motion.  Edema: Trace  Mental Status: Normal mood and affect. Normal behavior. Normal judgment and thought content.   Urinalysis:      Assessment and Plan:  Pregnancy: G1P0 at [redacted]w[redacted]d  1. Encounter for supervision of normal first pregnancy in third trimester GBS and vaginal cultures today Labor precautions  Term labor symptoms and general obstetric precautions including but not limited to vaginal bleeding, contractions, leaking of fluid and fetal movement were reviewed in detail with the patient. Please refer to After Visit Summary for other counseling recommendations.  Return in about 1 week (around  03/12/2021) for face to face, any provider.   Hermina Staggers, MD

## 2021-03-05 NOTE — Patient Instructions (Signed)

## 2021-03-05 NOTE — Addendum Note (Signed)
Addended by: Natale Milch D on: 03/05/2021 10:39 AM   Modules accepted: Orders

## 2021-03-06 ENCOUNTER — Other Ambulatory Visit: Payer: Self-pay

## 2021-03-06 ENCOUNTER — Inpatient Hospital Stay (HOSPITAL_COMMUNITY)
Admission: AD | Admit: 2021-03-06 | Discharge: 2021-03-06 | Disposition: A | Discharge status: Home / Self Care | Payer: Medicaid Other | Attending: Obstetrics & Gynecology | Admitting: Obstetrics & Gynecology

## 2021-03-06 DIAGNOSIS — O99613 Diseases of the digestive system complicating pregnancy, third trimester: Secondary | ICD-10-CM | POA: Diagnosis not present

## 2021-03-06 DIAGNOSIS — Z3689 Encounter for other specified antenatal screening: Secondary | ICD-10-CM | POA: Insufficient documentation

## 2021-03-06 DIAGNOSIS — Z7982 Long term (current) use of aspirin: Secondary | ICD-10-CM | POA: Diagnosis not present

## 2021-03-06 DIAGNOSIS — R109 Unspecified abdominal pain: Secondary | ICD-10-CM | POA: Insufficient documentation

## 2021-03-06 DIAGNOSIS — R197 Diarrhea, unspecified: Secondary | ICD-10-CM | POA: Diagnosis not present

## 2021-03-06 DIAGNOSIS — O26893 Other specified pregnancy related conditions, third trimester: Secondary | ICD-10-CM | POA: Insufficient documentation

## 2021-03-06 DIAGNOSIS — O26899 Other specified pregnancy related conditions, unspecified trimester: Secondary | ICD-10-CM

## 2021-03-06 DIAGNOSIS — M542 Cervicalgia: Secondary | ICD-10-CM

## 2021-03-06 DIAGNOSIS — Z79899 Other long term (current) drug therapy: Secondary | ICD-10-CM | POA: Insufficient documentation

## 2021-03-06 DIAGNOSIS — M545 Low back pain, unspecified: Secondary | ICD-10-CM | POA: Diagnosis not present

## 2021-03-06 DIAGNOSIS — Z3A37 37 weeks gestation of pregnancy: Secondary | ICD-10-CM | POA: Insufficient documentation

## 2021-03-06 DIAGNOSIS — Z3403 Encounter for supervision of normal first pregnancy, third trimester: Secondary | ICD-10-CM

## 2021-03-06 LAB — URINALYSIS, ROUTINE W REFLEX MICROSCOPIC
Bilirubin Urine: NEGATIVE
Glucose, UA: NEGATIVE mg/dL
Hgb urine dipstick: NEGATIVE
Ketones, ur: NEGATIVE mg/dL
Leukocytes,Ua: NEGATIVE
Nitrite: NEGATIVE
Protein, ur: NEGATIVE mg/dL
Specific Gravity, Urine: 1.012 (ref 1.005–1.030)
pH: 6 (ref 5.0–8.0)

## 2021-03-06 MED ORDER — ACETAMINOPHEN 500 MG PO TABS
1000.0000 mg | ORAL_TABLET | Freq: Once | ORAL | Status: AC
  Administered 2021-03-06: 1000 mg via ORAL
  Filled 2021-03-06: qty 2

## 2021-03-06 MED ORDER — CYCLOBENZAPRINE HCL 5 MG PO TABS
10.0000 mg | ORAL_TABLET | Freq: Once | ORAL | Status: AC
  Administered 2021-03-06: 10 mg via ORAL
  Filled 2021-03-06: qty 2

## 2021-03-06 MED ORDER — CYCLOBENZAPRINE HCL 10 MG PO TABS: 10.0000 mg | ORAL_TABLET | Freq: Two times a day (BID) | ORAL | 0 refills | Status: DC | PRN

## 2021-03-06 MED ORDER — LOPERAMIDE HCL 2 MG PO CAPS
2.0000 mg | ORAL_CAPSULE | Freq: Once | ORAL | Status: AC
  Administered 2021-03-06: 2 mg via ORAL
  Filled 2021-03-06: qty 1

## 2021-03-06 MED ORDER — ACETAMINOPHEN 325 MG PO TABS: 650.0000 mg | ORAL_TABLET | ORAL | 0 refills | Status: DC | PRN

## 2021-03-06 NOTE — MAU Note (Addendum)
Extreme pain all day 8/10, cramping sensation in lower abd started early am and sharp back pain more intense @1830 , Come and go pain, 8-10 min but unsure.    Nausea and diarrhea. Reports +FM, denies VB and LOF.

## 2021-03-06 NOTE — Discharge Instructions (Signed)

## 2021-03-06 NOTE — MAU Provider Note (Signed)
History     CSN: [id number]  Arrival date and time: 03/06/21 2105   Event Date/Time   First Provider Initiated Contact with Patient 03/06/21 2155      Chief Complaint  Patient presents with  . Back Pain   HPI Michelle Carr is a 20 y.o. G1P0 at [redacted]w[redacted]d who presents to MAU with chief complaints of low back pain, abdominal pain and diarrhea. These are new problems, onset early this morning. Patient endorses "almost continuous" diarrhea since onset. She denies exposure to new irritants or ill people. She has not taken medication or tried other treatments for this complaint.  Patient's low back pain is at her SI joint. It does not radiate. She describes it as "sharp". Pain score is 8/10.   Patient's abdominal pain is suprapubic, does not radiate. Onset this morning and intensifying throughout the day. She states the pain feels like contractions and occurs every 8-10 minutes.   She denies vaginal bleeding, leaking of fluid, decreased fetal movement, fever, falls, or recent illness.   Patient receives care with [facility] and her next appointment is 03/15/2021.  OB History    Gravida  1   Para      Term      Preterm      AB      Living        SAB      IAB      Ectopic      Multiple      Live Births              Past Medical History:  Diagnosis Date  . Medical history non-contributory     Past Surgical History:  Procedure Laterality Date  . NO PAST SURGERIES      Family History  Problem Relation Age of Onset  . Hyperlipidemia Mother   . Hyperlipidemia Maternal Grandmother   . Diabetes Maternal Grandmother   . Hypertension Maternal Grandmother     Social History   Tobacco Use  . Smoking status: Never Smoker  . Smokeless tobacco: Never Used  Vaping Use  . Vaping Use: Never used  Substance Use Topics  . Alcohol use: No  . Drug use: No    Allergies:  Allergies  Allergen Reactions  . Shellfish Allergy     Medications Prior to Admission   Medication Sig Dispense Refill Last Dose  . aspirin EC 81 MG tablet Take 1 tablet (81 mg total) by mouth daily. 60 tablet 2 03/06/2021 at Unknown time  . Prenatal-Fe Fum-Methf-FA w/o A (VITAFOL-NANO) 18-0.6-0.4 MG TABS Take 1 tablet by mouth daily. 30 tablet 11 03/06/2021 at Unknown time  . Blood Pressure Monitoring (BLOOD PRESSURE KIT) DEVI 1 kit by Does not apply route once a week. Check Blood Pressure regularly and record readings into the [employer] App.  Large Cuff.  DX O90.0 1 each 0     Review of Systems  Gastrointestinal: Positive for abdominal pain and diarrhea.  Musculoskeletal: Positive for back pain.  All other systems reviewed and are negative.  Physical Exam   Blood pressure (!) 118/58, pulse 90, temperature 97.7 F (36.5 C), temperature source Oral, resp. rate 18, height $RemoveBe'5\' 5"'eMfHqvtVn$  (1.651 m), weight 112.6 kg, last menstrual period 05/29/2020.  Physical Exam Vitals and nursing note reviewed. Exam conducted with a chaperone present.  Cardiovascular:     Rate and Rhythm: Normal rate.     Pulses: Normal pulses.     Heart sounds: Normal heart sounds.  Pulmonary:  Effort: Pulmonary effort is normal.     Breath sounds: Normal breath sounds.  Abdominal:     Tenderness: There is no right CVA tenderness or left CVA tenderness.     Comments: Gravid  Skin:    Capillary Refill: Capillary refill takes less than 2 seconds.  Neurological:     Mental Status: She is alert and oriented to person, place, and time.  Psychiatric:        Mood and Affect: Mood normal.        Behavior: Behavior normal.        Thought Content: Thought content normal.        Judgment: Judgment normal.     MAU Course  Procedures  --Reactive tracing: baseline 135, mod var, + 15 x 15 accels, no decels --Toco: occasional contractions, UI --Cervix remains 1 cm 75 minutes after initial assessment --Pain score not significantly impacted by PO medications given in MAU --Discussed influence of fetal position  on back pain. Reviewed positions from [place]  Patient Vitals for the past 24 hrs:  BP Temp Temp src Pulse Resp Height Weight  03/06/21 2322 (!) 116/56 -- -- 89 16 -- --  03/06/21 2124 (!) 118/58 -- -- 90 -- $Rem'5\' 5"'jWRC$  (1.651 m) 112.6 kg  03/06/21 2122 -- 97.7 F (36.5 C) Oral -- 18 -- --   Orders Placed This Encounter  Procedures  . Urinalysis, Routine w reflex microscopic Urine, Clean Catch  . External Fetal Monitoring for all patients >/= 23 weeks  . Assess fetal heart tones for all patients >/= 12 weeks  . Discharge patient   Results for orders placed or performed during the hospital encounter of 03/06/21 (from the past 24 hour(s))  Urinalysis, Routine w reflex microscopic Urine, Clean Catch     Status: None   Collection Time: 03/06/21 10:01 PM  Result Value Ref Range   Color, Urine YELLOW YELLOW   APPearance CLEAR CLEAR   Specific Gravity, Urine 1.012 1.005 - 1.030   pH 6.0 5.0 - 8.0   Glucose, UA NEGATIVE NEGATIVE mg/dL   Hgb urine dipstick NEGATIVE NEGATIVE   Bilirubin Urine NEGATIVE NEGATIVE   Ketones, ur NEGATIVE NEGATIVE mg/dL   Protein, ur NEGATIVE NEGATIVE mg/dL   Nitrite NEGATIVE NEGATIVE   Leukocytes,Ua NEGATIVE NEGATIVE   Meds ordered this encounter  Medications  . loperamide (IMODIUM) capsule 2 mg  . acetaminophen (TYLENOL) tablet 1,000 mg  . cyclobenzaprine (FLEXERIL) tablet 10 mg  . acetaminophen (TYLENOL) 325 MG tablet    Sig: Take 2 tablets (650 mg total) by mouth every 4 (four) hours as needed for up to 30 doses for mild pain.    Dispense:  60 tablet    Refill:  0    Order Specific Question:   Supervising Provider    Answer:   Elonda Husky, LUTHER H [[id number]]  . cyclobenzaprine (FLEXERIL) 10 MG tablet    Sig: Take 1 tablet (10 mg total) by mouth 2 (two) times daily as needed for muscle spasms.    Dispense:  20 tablet    Refill:  0    Order Specific Question:   Supervising Provider    Answer:   Tania Ade H [[id number]]   Assessment and Plan  --20 y.o. G1P0  at [redacted]w[redacted]d  --Reactive tracing --Cervix 1cm --Diarrhea, continue OTC Imodium PRN --Discharge home in stable condition  Darlina Rumpf, CNM 03/07/2021, 12:16 AM

## 2021-03-07 LAB — STREP GP B NAA: Strep Gp B NAA: NEGATIVE

## 2021-03-09 LAB — CERVICOVAGINAL ANCILLARY ONLY
Chlamydia: NEGATIVE
Comment: NEGATIVE
Comment: NORMAL
Neisseria Gonorrhea: NEGATIVE

## 2021-03-15 ENCOUNTER — Other Ambulatory Visit: Payer: Self-pay

## 2021-03-15 ENCOUNTER — Ambulatory Visit (INDEPENDENT_AMBULATORY_CARE_PROVIDER_SITE_OTHER): Payer: Medicaid Other | Admitting: Family Medicine

## 2021-03-15 VITALS — BP 122/70 | HR 82 | Wt 248.0 lb

## 2021-03-15 DIAGNOSIS — Z148 Genetic carrier of other disease: Secondary | ICD-10-CM

## 2021-03-15 DIAGNOSIS — Z3403 Encounter for supervision of normal first pregnancy, third trimester: Secondary | ICD-10-CM

## 2021-03-15 LAB — POCT URINALYSIS DIPSTICK
Bilirubin, UA: NEGATIVE
Blood, UA: NEGATIVE
Glucose, UA: NEGATIVE
Ketones, UA: NEGATIVE
Leukocytes, UA: NEGATIVE
Nitrite, UA: NEGATIVE
Protein, UA: NEGATIVE
Spec Grav, UA: 1.02 (ref 1.010–1.025)
Urobilinogen, UA: 0.2 E.U./dL
pH, UA: 6 (ref 5.0–8.0)

## 2021-03-15 NOTE — Addendum Note (Signed)
Addended by: Marya Landry D on: 03/15/2021 05:05 PM   Modules accepted: Orders

## 2021-03-15 NOTE — Progress Notes (Signed)
   PRENATAL VISIT NOTE  Subjective:  Michelle Carr is a 20 y.o. G1P0 at [redacted]w[redacted]d being seen today for ongoing prenatal care.  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  Patient reports no complaints.  Contractions: Not present. Vag. Bleeding: None.  Movement: Present. Denies leaking of fluid.   The following portions of the patient's history were reviewed and updated as appropriate: allergies, current medications, past family history, past medical history, past social history, past surgical history and problem list.   Objective:   Vitals:   03/15/21 1605  BP: 122/70  Pulse: 82  Weight: 248 lb (112.5 kg)    Fetal Status: Fetal Heart Rate (bpm): 135   Movement: Present     General:  Alert, oriented and cooperative. Patient is in no acute distress.  Skin: Skin is warm and dry. No rash noted.   Cardiovascular: Normal heart rate noted  Respiratory: Normal respiratory effort, no problems with respiration noted  Abdomen: Soft, gravid, appropriate for gestational age.  Pain/Pressure: Present     Pelvic: Cervical exam deferred        Extremities: Normal range of motion.     Mental Status: Normal mood and affect. Normal behavior. Normal judgment and thought content.   Assessment and Plan:  Pregnancy: G1P0 at [redacted]w[redacted]d 1. Encounter for supervision of normal first pregnancy in third trimester Doing well without complaints VSS Taking PNV Discussed contraception, would like post placental liletta. PD-IOL scheduled 41 weeks  2. 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  3. Morbid obesity (HCC) BMI 41  Term labor symptoms and general obstetric precautions including but not limited to vaginal bleeding, contractions, leaking of fluid and fetal movement  were reviewed in detail with the patient. Please refer to After Visit Summary for other counseling recommendations.   No follow-ups on file.  No future appointments.  Alric Seton, MD

## 2021-03-15 NOTE — Progress Notes (Signed)
Pt states urinary frequency, will dip today.  Pt would like cervix check.

## 2021-03-16 ENCOUNTER — Other Ambulatory Visit: Payer: Self-pay | Admitting: Family Medicine

## 2021-03-16 NOTE — Progress Notes (Signed)
PD-IOL orders placed.

## 2021-03-18 ENCOUNTER — Telehealth (HOSPITAL_COMMUNITY): Payer: Self-pay | Admitting: *Deleted

## 2021-03-18 ENCOUNTER — Encounter (HOSPITAL_COMMUNITY): Payer: Self-pay | Admitting: *Deleted

## 2021-03-18 NOTE — Telephone Encounter (Signed)
Preadmission screen  

## 2021-03-22 ENCOUNTER — Ambulatory Visit (INDEPENDENT_AMBULATORY_CARE_PROVIDER_SITE_OTHER): Payer: Medicaid Other | Admitting: Obstetrics and Gynecology

## 2021-03-22 ENCOUNTER — Other Ambulatory Visit: Payer: Self-pay

## 2021-03-22 VITALS — BP 115/74 | HR 87 | Wt 252.0 lb

## 2021-03-22 DIAGNOSIS — Z833 Family history of diabetes mellitus: Secondary | ICD-10-CM

## 2021-03-22 DIAGNOSIS — Z148 Genetic carrier of other disease: Secondary | ICD-10-CM

## 2021-03-22 DIAGNOSIS — Z3A39 39 weeks gestation of pregnancy: Secondary | ICD-10-CM

## 2021-03-22 DIAGNOSIS — Z3403 Encounter for supervision of normal first pregnancy, third trimester: Secondary | ICD-10-CM

## 2021-03-22 NOTE — Progress Notes (Signed)
Pt states she is having lower abdominal discomfort. Also increased acid reflux. She is not currently taking any medications for this.

## 2021-03-22 NOTE — Progress Notes (Signed)
   PRENATAL VISIT NOTE  Subjective:  Michelle Carr is a 20 y.o. G1P0 at [redacted]w[redacted]d being seen today for ongoing prenatal care.  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  Patient doing well with no acute concerns today. She reports no complaints.  Contractions: Irritability. Vag. Bleeding: None.  Movement: Present. Denies leaking of fluid.   The following portions of the patient's history were reviewed and updated as appropriate: allergies, current medications, past family history, past medical history, past social history, past surgical history and problem list. Problem list updated.  Objective:   Vitals:   03/22/21 1607  BP: 115/74  Pulse: 87  Weight: 252 lb (114.3 kg)    Fetal Status: Fetal Heart Rate (bpm): 141 Fundal Height: 38 cm Movement: Present     General:  Alert, oriented and cooperative. Patient is in no acute distress.  Skin: Skin is warm and dry. No rash noted.   Cardiovascular: Normal heart rate noted  Respiratory: Normal respiratory effort, no problems with respiration noted  Abdomen: Soft, gravid, appropriate for gestational age.  Pain/Pressure: Present     Pelvic: Cervical exam deferred        Extremities: Normal range of motion.  Edema: Trace  Mental Status:  Normal mood and affect. Normal behavior. Normal judgment and thought content.   Assessment and Plan:  Pregnancy: G1P0 at [redacted]w[redacted]d  1. Encounter for supervision of normal first pregnancy in third trimester Pt postdates at next visit, NST scheduled, per pt IOL scheduled for 03/02/21 - Fetal nonstress test; Future  2. [redacted] weeks gestation of pregnancy   3. 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 Family history of diabetes mellitus   Term labor symptoms and general  obstetric precautions including but not limited to vaginal bleeding, contractions, leaking of fluid and fetal movement were reviewed in detail with the patient.  Please refer to After Visit Summary for other counseling recommendations.   Return in about 1 week (around 03/29/2021) for ROB, in person.   Mariel Aloe, MD Faculty Attending Center for [facility]

## 2021-03-22 NOTE — Patient Instructions (Signed)
Nonstress Test A nonstress test is a procedure that is done during pregnancy in order to check the baby's heartbeat. The procedure can help to show if the baby (fetus) is healthy. It is commonly done when:  The baby is past his or her due date.  The pregnancy is high risk.  The baby is moving less than normal.  The mother has lost a pregnancy in the past.  The health care provider suspects a problem with the baby's growth.  There is too much or too little amniotic fluid. The procedure is often done in the third trimester of pregnancy to find out if an early delivery is needed and whether such a delivery is safe. During a nonstress test, the baby's heartbeat is monitored when the baby is resting and when the baby is moving. If the baby is healthy, the heart rate will increase when he or she moves or kicks and will return to normal when he or she rests. Tell a health care provider about:  Any allergies you have.  Any medical conditions you have.  All medicines you are taking, including vitamins, herbs, eye drops, creams, and over-the-counter medicines.  Any surgeries you have had.  Any past pregnancies you have had. What are the risks? There are no risks to you or your baby from a nonstress test. This procedure should not be painful or uncomfortable. What happens before the procedure?  Eat a meal right before the test or as directed by your health care provider. Food may help encourage the baby to move.  Use the restroom right before the test. What happens during the procedure?  Two monitors will be placed on your abdomen. One will record the baby's heart rate and the other will record the contractions of your uterus.  You may be asked to lie down on your side or to sit upright.  You may be given a button to press when you feel your baby move.  Your health care provider will listen to your baby's heartbeat and record it. He or she may also watch your baby's heartbeat on a  screen.  If the baby seems to be sleeping, you may be asked to drink some juice or soda, eat a snack, or change positions. The procedure may vary among health care providers and hospitals.   What can I expect after procedure?  Your health care provider will discuss the test results with you and make recommendations for the future. Depending on the results, your health care provider may order additional tests or another course of action.  If your health care provider gave you any diet or activity instructions, make sure to follow them.  Keep all follow-up visits. This is important. Summary  A nonstress test is a procedure that is done during pregnancy in order to check the baby's heartbeat. The procedure can help show if the baby is healthy.  The procedure is often done in the third trimester of pregnancy to find out if an early delivery is needed and whether such a delivery is safe.  During a nonstress test, the baby's heartbeat is monitored when the baby is resting and when the baby is moving. If the baby is healthy, the heart rate will increase when he or she moves or kicks and will return to normal when he or she rests.  Your health care provider will discuss the test results with you and make recommendations for the future. This information is not intended to replace advice given to you   by your health care provider. Make sure you discuss any questions you have with your health care provider. Document Revised: 08/24/2020 Document Reviewed: 08/24/2020 Elsevier Patient Education  2021 [employer]. Labor Induction Labor induction is when steps are taken to cause a pregnant woman to begin the labor process. Most women go into labor on their own between 37 weeks and 42 weeks of pregnancy. When this does not happen, or when there is a medical need for labor to begin, steps may be taken to induce, or bring on, labor. Labor induction causes a pregnant woman's uterus to contract. It also causes  the cervix to soften (ripen), open (dilate), and thin out. Usually, labor is not induced before 39 weeks of pregnancy unless there is a medical reason to do so. When is labor induction considered? Labor induction may be right for you if:  Your pregnancy lasts longer than 41 to 42 weeks.  Your placenta is separating from your uterus (placental abruption).  You have a rupture of membranes and your labor does not begin.  You have health problems, like diabetes or high blood pressure (preeclampsia) during your pregnancy.  Your baby has stopped growing or does not have enough amniotic fluid. Before labor induction begins, your health care provider will consider the following factors:  Your medical condition and the baby's condition.  How many weeks you have been pregnant.  How mature the baby's lungs are.  The condition of your cervix.  The position of the baby.  The size of your birth canal. Tell a health care provider about:  Any allergies you have.  All medicines you are taking, including vitamins, herbs, eye drops, creams, and over-the-counter medicines.  Any problems you or your family members have had with anesthetic medicines.  Any surgeries you have had.  Any blood disorders you have.  Any medical conditions you have. What are the risks? Generally, this is a safe procedure. However, problems may occur, including:  Failed induction.  Changes in fetal heart rate, such as being too high, too low, or irregular (erratic).  Infection in the mother or the baby.  Increased risk of having a cesarean delivery.  Breaking off (abruption) of the placenta from the uterus. This is rare.  Rupture of the uterus. This is very rare.  Your baby could fail to get enough blood flow or oxygen. This can be life-threatening. When induction is needed for medical reasons, the benefits generally outweigh the risks. What happens during the procedure? During the procedure, your health  care provider will use one of these methods to induce labor:  Stripping the membranes. In this method, the amniotic sac tissue is gently separated from the cervix. This causes the following to happen: ? Your cervix stretches, which in turn causes the release of prostaglandins. ? Prostaglandins induce labor and cause the uterus to contract. ? This procedure is often done in an office visit. You will be sent home to wait for contractions to begin.  Prostaglandin medicine. This medicine starts contractions and causes the cervix to dilate and ripen. This can be taken by mouth (orally) or by being inserted into the vagina (suppository).  Inserting a small, thin tube (catheter) with a balloon into the vagina and then expanding the balloon with water to dilate the cervix.  Breaking the water. In this method, a small instrument is used to make a small hole in the amniotic sac. This eventually causes the amniotic sac to break. Contractions should begin within a few hours.  Medicine to trigger or strengthen contractions. This medicine is given through an IV that is inserted into a vein in your arm. This procedure may vary among health care providers and hospitals.   Where to find more information  March of Dimes: www.marchofdimes.org  [employer] of [employer]st[employer]ians and [employer]necologists: www.acog.org Summary  Labor induction causes a pregnant woman's uterus to contract. It also causes the cervix to soften (ripen), open (dilate), and thin out.  Labor is usually not induced before 39 weeks of pregnancy unless there is a medical reason to do so.  When induction is needed for medical reasons, the benefits generally outweigh the risks.  Talk with your health care provider about which methods of labor induction are right for you. This information is not intended to replace advice given to you by your health care provider. Make sure you discuss any questions you have with your health care  provider. Document Revised: 08/27/2020 Document Reviewed: 08/27/2020 Elsevier Patient Education  2021 [employer].

## 2021-03-23 ENCOUNTER — Other Ambulatory Visit: Payer: Self-pay | Admitting: Advanced Practice Midwife

## 2021-03-29 ENCOUNTER — Ambulatory Visit (INDEPENDENT_AMBULATORY_CARE_PROVIDER_SITE_OTHER): Payer: Medicaid Other | Admitting: Obstetrics and Gynecology

## 2021-03-29 ENCOUNTER — Inpatient Hospital Stay (HOSPITAL_COMMUNITY)
Admission: AD | Admit: 2021-03-29 | Discharge: 2021-03-31 | DRG: 806 | Disposition: A | Discharge status: Home / Self Care | Payer: Medicaid Other | Attending: Family Medicine | Admitting: Family Medicine

## 2021-03-29 ENCOUNTER — Inpatient Hospital Stay (HOSPITAL_COMMUNITY): Payer: Medicaid Other | Admitting: Anesthesiology

## 2021-03-29 ENCOUNTER — Encounter (HOSPITAL_COMMUNITY): Payer: Self-pay | Admitting: Family Medicine

## 2021-03-29 ENCOUNTER — Encounter: Payer: Self-pay | Admitting: Obstetrics and Gynecology

## 2021-03-29 ENCOUNTER — Other Ambulatory Visit: Payer: Self-pay

## 2021-03-29 VITALS — BP 109/73 | HR 94 | Wt 254.0 lb

## 2021-03-29 DIAGNOSIS — Z3400 Encounter for supervision of normal first pregnancy, unspecified trimester: Secondary | ICD-10-CM

## 2021-03-29 DIAGNOSIS — O9902 Anemia complicating childbirth: Principal | ICD-10-CM | POA: Diagnosis present

## 2021-03-29 DIAGNOSIS — Z3490 Encounter for supervision of normal pregnancy, unspecified, unspecified trimester: Secondary | ICD-10-CM | POA: Diagnosis present

## 2021-03-29 DIAGNOSIS — O99214 Obesity complicating childbirth: Secondary | ICD-10-CM | POA: Diagnosis present

## 2021-03-29 DIAGNOSIS — E669 Obesity, unspecified: Secondary | ICD-10-CM | POA: Diagnosis present

## 2021-03-29 DIAGNOSIS — D573 Sickle-cell trait: Secondary | ICD-10-CM | POA: Diagnosis present

## 2021-03-29 DIAGNOSIS — Z975 Presence of (intrauterine) contraceptive device: Secondary | ICD-10-CM

## 2021-03-29 DIAGNOSIS — Z3A40 40 weeks gestation of pregnancy: Secondary | ICD-10-CM | POA: Diagnosis not present

## 2021-03-29 DIAGNOSIS — Z3403 Encounter for supervision of normal first pregnancy, third trimester: Secondary | ICD-10-CM

## 2021-03-29 DIAGNOSIS — Z20822 Contact with and (suspected) exposure to covid-19: Secondary | ICD-10-CM | POA: Diagnosis present

## 2021-03-29 DIAGNOSIS — Z30430 Encounter for insertion of intrauterine contraceptive device: Secondary | ICD-10-CM

## 2021-03-29 DIAGNOSIS — O26893 Other specified pregnancy related conditions, third trimester: Secondary | ICD-10-CM | POA: Diagnosis present

## 2021-03-29 DIAGNOSIS — O480 Post-term pregnancy: Secondary | ICD-10-CM | POA: Diagnosis not present

## 2021-03-29 DIAGNOSIS — O368330 Maternal care for abnormalities of the fetal heart rate or rhythm, third trimester, not applicable or unspecified: Secondary | ICD-10-CM | POA: Diagnosis not present

## 2021-03-29 LAB — CBC
HCT: 35.3 % — ABNORMAL LOW (ref 36.0–46.0)
Hemoglobin: 12.3 g/dL (ref 12.0–15.0)
MCH: 29.8 pg (ref 26.0–34.0)
MCHC: 34.8 g/dL (ref 30.0–36.0)
MCV: 85.5 fL (ref 80.0–100.0)
Platelets: 255 10*3/uL (ref 150–400)
RBC: 4.13 MIL/uL (ref 3.87–5.11)
RDW: 12.6 % (ref 11.5–15.5)
WBC: 9.3 10*3/uL (ref 4.0–10.5)
nRBC: 0 % (ref 0.0–0.2)

## 2021-03-29 LAB — TYPE AND SCREEN
ABO/RH(D): A POS
Antibody Screen: NEGATIVE

## 2021-03-29 LAB — RESP PANEL BY RT-PCR (FLU A&B, COVID) ARPGX2
Influenza A by PCR: NEGATIVE
Influenza B by PCR: NEGATIVE
SARS Coronavirus 2 by RT PCR: NEGATIVE

## 2021-03-29 MED ORDER — LACTATED RINGERS IV SOLN
500.0000 mL | Freq: Once | INTRAVENOUS | Status: AC
Start: 2021-03-29 — End: 2021-03-29
  Administered 2021-03-29: 500 mL via INTRAVENOUS

## 2021-03-29 MED ORDER — LACTATED RINGERS IV SOLN: INTRAVENOUS | Status: DC

## 2021-03-29 MED ORDER — ONDANSETRON HCL 4 MG/2ML IJ SOLN: 4.0000 mg | Freq: Four times a day (QID) | INTRAMUSCULAR | Status: DC | PRN

## 2021-03-29 MED ORDER — EPHEDRINE 5 MG/ML INJ: 10.0000 mg | INTRAVENOUS | Status: DC | PRN

## 2021-03-29 MED ORDER — OXYTOCIN-SODIUM CHLORIDE 30-0.9 UT/500ML-% IV SOLN: 1.0000 m[IU]/min | INTRAVENOUS | Status: DC

## 2021-03-29 MED ORDER — LACTATED RINGERS IV SOLN
500.0000 mL | INTRAVENOUS | Status: DC | PRN
  Administered 2021-03-29 (×2): 500 mL via INTRAVENOUS

## 2021-03-29 MED ORDER — LIDOCAINE HCL (PF) 1 % IJ SOLN: 30.0000 mL | INTRAMUSCULAR | Status: DC | PRN

## 2021-03-29 MED ORDER — ACETAMINOPHEN 325 MG PO TABS: 650.0000 mg | ORAL_TABLET | ORAL | Status: DC | PRN

## 2021-03-29 MED ORDER — SOD CITRATE-CITRIC ACID 500-334 MG/5ML PO SOLN
30.0000 mL | ORAL | Status: DC | PRN
  Filled 2021-03-29: qty 15

## 2021-03-29 MED ORDER — PHENYLEPHRINE 40 MCG/ML (10ML) SYRINGE FOR IV PUSH (FOR BLOOD PRESSURE SUPPORT): 80.0000 ug | PREFILLED_SYRINGE | INTRAVENOUS | Status: DC | PRN

## 2021-03-29 MED ORDER — FENTANYL-BUPIVACAINE-NACL 0.5-0.125-0.9 MG/250ML-% EP SOLN
12.0000 mL/h | EPIDURAL | Status: DC | PRN
  Administered 2021-03-29: 12 mL/h via EPIDURAL
  Filled 2021-03-29: qty 250

## 2021-03-29 MED ORDER — TERBUTALINE SULFATE 1 MG/ML IJ SOLN: 0.2500 mg | Freq: Once | INTRAMUSCULAR | Status: DC | PRN

## 2021-03-29 MED ORDER — DIPHENHYDRAMINE HCL 50 MG/ML IJ SOLN: 12.5000 mg | INTRAMUSCULAR | Status: DC | PRN

## 2021-03-29 MED ORDER — LIDOCAINE HCL (PF) 1 % IJ SOLN
INTRAMUSCULAR | Status: DC | PRN
  Administered 2021-03-29: 10 mL via EPIDURAL

## 2021-03-29 MED ORDER — LEVONORGESTREL 20.1 MCG/DAY IU IUD
1.0000 | INTRAUTERINE_SYSTEM | Freq: Once | INTRAUTERINE | Status: AC
  Administered 2021-03-30: 1 via INTRAUTERINE
  Filled 2021-03-29: qty 1

## 2021-03-29 MED ORDER — FENTANYL CITRATE (PF) 100 MCG/2ML IJ SOLN
50.0000 ug | INTRAMUSCULAR | Status: DC | PRN
  Administered 2021-03-29: 100 ug via INTRAVENOUS
  Filled 2021-03-29: qty 2

## 2021-03-29 MED ORDER — OXYCODONE-ACETAMINOPHEN 5-325 MG PO TABS: 2.0000 | ORAL_TABLET | ORAL | Status: DC | PRN

## 2021-03-29 MED ORDER — OXYTOCIN BOLUS FROM INFUSION
333.0000 mL | Freq: Once | INTRAVENOUS | Status: AC
  Administered 2021-03-30: 333 mL via INTRAVENOUS

## 2021-03-29 MED ORDER — OXYTOCIN-SODIUM CHLORIDE 30-0.9 UT/500ML-% IV SOLN: 2.5000 [IU]/h | INTRAVENOUS | Status: DC

## 2021-03-29 MED ORDER — OXYCODONE-ACETAMINOPHEN 5-325 MG PO TABS
1.0000 | ORAL_TABLET | ORAL | Status: DC | PRN
Start: 2021-03-29 — End: 2021-03-30

## 2021-03-29 MED ORDER — MISOPROSTOL 50MCG HALF TABLET: 50.0000 ug | ORAL_TABLET | ORAL | Status: DC | PRN

## 2021-03-29 MED ORDER — OXYTOCIN-SODIUM CHLORIDE 30-0.9 UT/500ML-% IV SOLN
1.0000 m[IU]/min | INTRAVENOUS | Status: DC
  Administered 2021-03-29: 1 m[IU]/min via INTRAVENOUS
  Filled 2021-03-29: qty 500

## 2021-03-29 NOTE — Progress Notes (Signed)
Labor Progress Note Michelle Carr is a 20 y.o. G1P0 at [redacted]w[redacted]d presented for IOL for NRNST  S:  Comfortable with epidural.  O:  BP 132/75   Pulse 80   Temp 98.5 F (36.9 C) (Oral)   Resp 16   Ht 5\' 7"  (1.702 m)   Wt 115.2 kg   LMP 05/29/2020   SpO2 99%   BMI 39.78 kg/m  EFM: baseline 140 bpm/ mod variability/ no accels/ mild variables decels  Toco/IUPC: q1-2 SVE: Dilation: 4.5 Effacement (%): 60 Station: -3 Presentation: Vertex Exam by:: Mary [email or web address] Johnson, RN Pitocin: 4 mu/min  A/P: 20 y.o. G1P0 [redacted]w[redacted]d  1. Labor: latent 2. FWB: Cat II 3. Pain: epidural  S/p FB. Continue Pitocin. Unclear MOD, hopeful for SVD.  [redacted]w[redacted]d, CNM 7:24 PM   IO

## 2021-03-29 NOTE — Progress Notes (Signed)
   PRENATAL VISIT NOTE  Subjective:  Michelle Carr is a 20 y.o. G1P0 at [redacted]w[redacted]d being seen today for ongoing prenatal care.  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  Patient reports no complaints.  Contractions: Irritability. Vag. Bleeding: None.  Movement: Present. Denies leaking of fluid.   The following portions of the patient's history were reviewed and updated as appropriate: allergies, current medications, past family history, past medical history, past social history, past surgical history and problem list.   Objective:   Vitals:   03/29/21 0909  BP: 109/73  Pulse: 94  Weight: 254 lb (115.2 kg)    Fetal Status: Fetal Heart Rate (bpm): NST  Fundal Height: 40 cm Movement: Present     General:  Alert, oriented and cooperative. Patient is in no acute distress.  Skin: Skin is warm and dry. No rash noted.   Cardiovascular: Normal heart rate noted  Respiratory: Normal respiratory effort, no problems with respiration noted  Abdomen: Soft, gravid, appropriate for gestational age.  Pain/Pressure: Present     Pelvic: Cervical exam deferred        Extremities: Normal range of motion.  Edema: Trace  Mental Status: Normal mood and affect. Normal behavior. Normal judgment and thought content.   Assessment and Plan:  Pregnancy: G1P0 at [redacted]w[redacted]d 1. [redacted] weeks gestation of pregnancy   2. Supervision of normal first pregnancy, antepartum Patient is doing well without complaints  3. Post-term pregnancy, 40-42 weeks of gestation Scheduled for IOL at 41wks NST baseline 150, min-mod variability, no accels, + variable decels x3 Patient sent to [facility] for IOL today- report given to on call team  Term labor symptoms and general obstetric precautions including  but not limited to vaginal bleeding, contractions, leaking of fluid and fetal movement were reviewed in detail with the patient. Please refer to After Visit Summary for other counseling recommendations.   No follow-ups on file.  Future Appointments  Date Time Provider Department Center  03/30/2021  9:45 AM [facility]-SCREENING [facility] None  04/01/2021  9:00 AM [facility]-LD SCHED ROOM [facility] None    Catalina Antigua, MD

## 2021-03-29 NOTE — Anesthesia Procedure Notes (Signed)
Epidural Patient location during procedure: OB Start time: 03/29/2021 6:17 PM End time: 03/29/2021 6:31 PM  Staffing Anesthesiologist: Lucretia Kern, MD Performed: anesthesiologist   Preanesthetic Checklist Completed: patient identified, IV checked, risks and benefits discussed, monitors and equipment checked, pre-op evaluation and timeout performed  Epidural Patient position: sitting Prep: DuraPrep Patient monitoring: heart rate, continuous pulse ox and blood pressure Approach: midline Location: L3-L4 Injection technique: LOR air  Needle:  Needle type: Tuohy  Needle gauge: 17 G Needle length: 9 cm Needle insertion depth: 7 cm Catheter type: closed end flexible Catheter size: 19 Gauge Catheter at skin depth: 12 cm Test dose: negative  Assessment Events: blood not aspirated, injection not painful, no injection resistance, no paresthesia and negative IV test  Additional Notes Reason for block:procedure for pain

## 2021-03-29 NOTE — Progress Notes (Signed)
Labor Progress Note Michelle Carr is a 20 y.o. G1P0 at [redacted]w[redacted]d sent from office for IOL for NRNST and FHR decels. Called to bedside for FHR decel.   S:  Comfortable, no c/o.   O:  BP 138/82   Pulse 87   Temp 98.3 F (36.8 C) (Oral)   Ht 5\' 7"  (1.702 m)   Wt 115.2 kg   LMP 05/29/2020   BMI 39.78 kg/m  EFM: baseline 135 bpm/ mod variability/ no accels/ prolonged decel Toco/IUPC: rare SVE: Dilation: 2 Effacement (%): Thick Station: -3 Presentation: Vertex Exam by:: Chanti Golubski, CNM   A/P: 20 y.o. G1P0 103w4d  Prolonged FHR decel x9 min. Repositioned to right lateral. IVF bolus. Dr. [redacted]w[redacted]d notified. Will allow FHR recovery and consider CST.    Crissie Reese, CNM 11:58 AM

## 2021-03-29 NOTE — Progress Notes (Signed)
Labor Progress Note Michelle Carr is a 20 y.o. G1P0 at [redacted]w[redacted]d presented for NRNST. Upon admission had prolonged FHR decel. CST in process.   S:  Comfortable, not feeling ctx.   O:  BP 131/75   Pulse 88   Temp 98.3 F (36.8 C) (Oral)   Ht 5\' 7"  (1.702 m)   Wt 115.2 kg   LMP 05/29/2020   BMI 39.78 kg/m  EFM: baseline 135 bpm/ min-mod variability/ no accels/ rare mild variable decel Toco/IUPC: irregular SVE: Dilation: 2.5 Effacement (%): Thick Station: -3 Presentation: Vertex Exam by:: [email or web address], CNM Pitocin: 4 mu/min  A/P: 20 y.o. G1P0 [redacted]w[redacted]d  1. Labor: latent 2. FWB: Cat II 3. Pain: analgesia prn  CST negative. Will proceed with IOL, pt agrees. Consented for FB, placed w/o difficulty. Low dose Pitocin up to 6 mu/min. Unclear MOD.   [redacted]w[redacted]d, CNM 3:21 PM

## 2021-03-29 NOTE — H&P (Addendum)
OBSTETRIC ADMISSION HISTORY AND PHYSICAL  Michelle Carr is a 20 y.o. female G1P0 with IUP at 75w4dby 5 week [place] for non-reactive NST. She reports +FMs, No LOF, no VB, no blurry vision, headaches or peripheral edema, and RUQ pain.  She plans on breast feeding. She request IP [name] for birth control. 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 Medical history non-contributory     Past Surgical History: Past Surgical History:  Procedure Laterality Date  . NO PAST SURGERIES      Obstetrical History: OB History    Gravida  1   Para      Term      Preterm      AB      Living        SAB      IAB      Ectopic      Multiple      Live Births              Social History Social History   Socioeconomic History  . Marital status: Single    Spouse name: Not on file  . Number of children: Not on file  . Years of education: Not on file  . Highest education level: Not on file  Occupational History  . Not on file  Tobacco Use  . Smoking status: Never Smoker  . Smokeless tobacco: Never Used  Vaping Use  . Vaping Use: Never used  Substance and Sexual Activity  . Alcohol use: No  . Drug use: No  . Sexual activity: Yes  Other Topics Concern  . Not on file  Social History Narrative  . Not on file   Social Determinants of Health   Financial Resource Strain: Not on file  Food Insecurity: Not on file  Transportation Needs: Not on file  Physical Activity: Not on file  Stress: Not on file  Social Connections: Not on file    Family History: Family History  Problem Relation Age of Onset  . Hyperlipidemia Mother   . Hyperlipidemia  Maternal Grandmother   . Diabetes Maternal Grandmother   . Hypertension Maternal Grandmother     Allergies: Allergies  Allergen Reactions  . Shellfish Allergy Swelling    Medications Prior to Admission  Medication Sig Dispense Refill Last Dose  . acetaminophen (TYLENOL) 325 MG tablet Take 2 tablets (650 mg total) by mouth every 4 (four) hours as needed for up to 30 doses for mild pain. (Patient not taking: Reported on 03/29/2021) 60 tablet 0   . aspirin EC 81 MG tablet Take 1 tablet (81 mg total) by mouth daily. 60 tablet 2   . Blood Pressure Monitoring (BLOOD PRESSURE KIT) DEVI 1 kit by Does not apply route once a week. Check Blood Pressure regularly and record readings into the Babyscripts App.  Large Cuff.  DX O90.0 1 each 0   . cyclobenzaprine (FLEXERIL) 10 MG tablet Take 1 tablet (10 mg total) by mouth 2 (two) times daily as needed for muscle spasms. (Patient not taking: No sig reported) 20 tablet 0   . Prenatal-Fe Fum-Methf-FA w/o A (VITAFOL-NANO) 18-0.6-0.4 MG TABS Take 1 tablet by mouth  daily. 30 tablet 11      Review of Systems   All systems reviewed and negative except as stated in HPI  Blood pressure 138/82, pulse 87, temperature 98.3 F (36.8 C), temperature source Oral, height _0  (1.702 m), weight 115.2 kg, last menstrual period 05/29/2020. General appearance: alert, cooperative and appears stated age Lungs: breathing comfortably Heart: regular rate and rhythm Abdomen: soft, non-tender; bowel sounds normal Presentation: cephalic Fetal monitoringBaseline: 150 bpm, Moderate Variability, No Accels, prolonged Decel Uterine activity: None Dilation: 2 Effacement (%): Thick Station: -3 Exam by:: Drake Leach, CNM  Prenatal labs: ABO, Rh: --/--/PENDING (05/02 1058) Antibody: PENDING (05/02 1058) Rubella: 4.33 (09/23 1407) RPR: Non Reactive (02/10 1115)  HBsAg: Negative (09/23 1407)  HIV: Non Reactive (02/10 1115)  GBS: Negative/-- (04/08 1035)  2 hr GTT:  Normal Genetic screening  Sickle Cell Trait Anatomy US Normal  Prenatal Transfer Tool  Maternal Diabetes: No Genetic Screening: Normal Maternal Ultrasounds/Referrals: Normal Fetal Ultrasounds or other Referrals:  None Maternal Substance Abuse:  No Significant Maternal Medications:  None Significant Maternal Lab Results: Group B Strep negative  Results for orders placed or performed during the hospital encounter of 03/29/21 (from the past 24 hour(s))  Type and screen   Collection Time: 03/29/21 10:58 AM  Result Value Ref Range   ABO/RH(D) PENDING    Antibody Screen PENDING    Sample Expiration      04/01/2021,2359 Performed at [facility], [place] [address] [place], [place] [address]   CBC   Collection Time: 03/29/21 10:59 AM  Result Value Ref Range   WBC 9.3 4.0 - 10.5 K/uL   RBC 4.13 3.87 - 5.11 MIL/uL   Hemoglobin 12.3 12.0 - 15.0 g/dL   HCT 35.3 (L) 36.0 - 46.0 %   MCV 85.5 80.0 - 100.0 fL   MCH 29.8 26.0 - 34.0 pg   MCHC 34.8 30.0 - 36.0 g/dL   RDW 12.6 11.5 - 15.5 %   Platelets 255 150 - 400 K/uL   nRBC 0.0 0.0 - 0.2 %    Patient Active Problem List   Diagnosis Date Noted  . Encounter for induction of labor 03/29/2021  . [redacted] weeks gestation of pregnancy 03/22/2021  . 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 Supervision of normal first pregnancy 08/20/2020  . Morning sickness 08/20/2020  . Family history of diabetes mellitus 05/01/2019  . Obesity 05/01/2019    Assessment/Plan:  Michelle Carr is a 20 y.o. G1P0 at 27w4dhere for IOL for non-reactive NST.  #Induction:  Patient not currently contracting.  Had prolonged 9 minute Decel.  Will plan for CST.  Discussed with patient possibility of C-section #Pain: PRN #FWB:  Category 2 #ID: GBS Negative #MOF: Breast #MOC: IP [name] #Circ: Yes  PDelora Fuel MD  03/29/2021, 12:02 PM   Midwife attestation: I have seen and examined this patient; I agree with above documentation in the resident's  note.   PE: Gen: calm comfortable, NAD Resp: normal effort and rate Abd: gravid  ROS, labs, PMH reviewed  Assessment/Plan: 40.[redacted] weeks gestation Labor: none FWB: Cat II GBS: neg Admit to LD Consider CST Unclear MOD  MJulianne Handler CNM  03/29/2021, 12:12 PM

## 2021-03-29 NOTE — Anesthesia Preprocedure Evaluation (Addendum)
Anesthesia Evaluation  Patient identified by MRN, date of birth, ID band Patient awake    Reviewed: Allergy & Precautions, Patient's Chart, lab work & pertinent test results  Airway Mallampati: II  TM Distance: >3 FB Neck ROM: Full    Dental no notable dental hx.    Pulmonary neg pulmonary ROS,    Pulmonary exam normal breath sounds clear to auscultation       Cardiovascular negative cardio ROS Normal cardiovascular exam Rhythm:Regular Rate:Normal     Neuro/Psych negative neurological ROS  negative psych ROS   GI/Hepatic negative GI ROS, Neg liver ROS,   Endo/Other  Morbid obesityBMI 40  Renal/GU negative Renal ROS  negative genitourinary   Musculoskeletal negative musculoskeletal ROS (+)   Abdominal (+) + obese,   Peds negative pediatric ROS (+)  Hematology negative hematology ROS (+) hct 35.3, plt 255   Anesthesia Other Findings   Reproductive/Obstetrics (+) Pregnancy                             Anesthesia Physical Anesthesia Plan  ASA: III and emergent  Anesthesia Plan: Epidural   Post-op Pain Management:    Induction:   PONV Risk Score and Plan: 2  Airway Management Planned: Natural Airway  Additional Equipment: None  Intra-op Plan:   Post-operative Plan:   Informed Consent: I have reviewed the patients History and Physical, chart, labs and discussed the procedure including the risks, benefits and alternatives for the proposed anesthesia with the patient or authorized representative who has indicated his/her understanding and acceptance.       Plan Discussed with:   Anesthesia Plan Comments:         Anesthesia Quick Evaluation

## 2021-03-29 NOTE — Progress Notes (Addendum)
Labor Progress Note Michelle Carr is a 20 y.o. G1P0 at [redacted]w[redacted]d presented for NRNST. Upon admission had prolonged FHR decel. CST was negative.   S:  Comfortable, no complaints  O:  BP 127/73   Pulse (!) 103   Temp 98.5 F (36.9 C) (Oral)   Resp 16   Ht 5\' 7"  (1.702 m)   Wt 115.2 kg   LMP 05/29/2020   SpO2 99%   BMI 39.78 kg/m  EFM: baseline 135 bpm/ mod variability/ + accels/ rare variable decel Toco/IUPC: irregular SVE: Dilation: 5.5 Effacement (%): 80 Station: -3 Presentation: Vertex Exam by:: Mary [email or web address] Johnson, RN Pitocin: 4 mu/min  A/P: 20 y.o. G1P0 [redacted]w[redacted]d  1. Labor: latent. FB out at 1800. Ongoing pitocin. Making good cervical change.   - Can consider AROM at next check 2. FWB: Cat I overall with rare variables. CST negative. 3. Pain: epidural  4. MOD: anticipate VD   [redacted]w[redacted]d, Medical Student 8:52 PM   GME ATTESTATION:  I saw and evaluated the patient. I agree with the findings and the plan of care as documented in the medical student's note.  Dot Lanes, MD OB Fellow, Faculty [facility], Center for [facility] 03/30/2021 12:30 AM

## 2021-03-29 NOTE — Progress Notes (Incomplete Revision)
Labor Progress Note Michelle Carr is a 20 y.o. G1P0 at [redacted]w[redacted]d presented for NRNST. Upon admission had prolonged FHR decel. CST was negative.   S:  Comfortable, no complaints  O:  BP 127/73   Pulse (!) 103   Temp 98.5 F (36.9 C) (Oral)   Resp 16   Ht 5\' 7"  (1.702 m)   Wt 115.2 kg   LMP 05/29/2020   SpO2 99%   BMI 39.78 kg/m  EFM: baseline 135 bpm/ mod variability/ + accels/ rare variable decel Toco/IUPC: irregular SVE: Dilation: 5.5 Effacement (%): 80 Station: -3 Presentation: Vertex Exam by:: Mary [email or web address] Johnson, RN Pitocin: 4 mu/min  A/P: 20 y.o. G1P0 [redacted]w[redacted]d  1. Labor: latent. FB out at 1800. Ongoing pitocin. Making good cervical change.   - Can consider AROM at next check 2. FWB: Cat I with rare variables. CST negative. 3. Pain: epidural  4. MOD: anticipate VD   [redacted]w[redacted]d, Medical Student 8:52 PM

## 2021-03-29 NOTE — Progress Notes (Signed)
Pt presents for ROB and NST. IOL scheduled for 03/31/21

## 2021-03-30 ENCOUNTER — Encounter (HOSPITAL_COMMUNITY): Payer: Self-pay | Admitting: Family Medicine

## 2021-03-30 ENCOUNTER — Other Ambulatory Visit (HOSPITAL_COMMUNITY): Payer: Medicaid Other

## 2021-03-30 DIAGNOSIS — Z975 Presence of (intrauterine) contraceptive device: Secondary | ICD-10-CM

## 2021-03-30 DIAGNOSIS — D573 Sickle-cell trait: Secondary | ICD-10-CM

## 2021-03-30 DIAGNOSIS — Z3A40 40 weeks gestation of pregnancy: Secondary | ICD-10-CM

## 2021-03-30 DIAGNOSIS — O368330 Maternal care for abnormalities of the fetal heart rate or rhythm, third trimester, not applicable or unspecified: Secondary | ICD-10-CM

## 2021-03-30 DIAGNOSIS — Z30430 Encounter for insertion of intrauterine contraceptive device: Secondary | ICD-10-CM

## 2021-03-30 DIAGNOSIS — O9902 Anemia complicating childbirth: Secondary | ICD-10-CM

## 2021-03-30 DIAGNOSIS — O480 Post-term pregnancy: Secondary | ICD-10-CM

## 2021-03-30 LAB — RPR: RPR Ser Ql: NONREACTIVE

## 2021-03-30 MED ORDER — ONDANSETRON HCL 4 MG PO TABS: 4.0000 mg | ORAL_TABLET | ORAL | Status: DC | PRN

## 2021-03-30 MED ORDER — DIPHENHYDRAMINE HCL 25 MG PO CAPS: 25.0000 mg | ORAL_CAPSULE | Freq: Four times a day (QID) | ORAL | Status: DC | PRN

## 2021-03-30 MED ORDER — SENNOSIDES-DOCUSATE SODIUM 8.6-50 MG PO TABS
2.0000 | ORAL_TABLET | ORAL | Status: DC
  Administered 2021-03-30 – 2021-03-31 (×2): 2 via ORAL
  Filled 2021-03-30 (×2): qty 2

## 2021-03-30 MED ORDER — BENZOCAINE-MENTHOL 20-0.5 % EX AERO
1.0000 "application " | INHALATION_SPRAY | CUTANEOUS | Status: DC | PRN
  Administered 2021-03-30: 1 via TOPICAL
  Filled 2021-03-30: qty 56

## 2021-03-30 MED ORDER — TETANUS-DIPHTH-ACELL PERTUSSIS 5-2.5-18.5 LF-MCG/0.5 IM SUSY: 0.5000 mL | PREFILLED_SYRINGE | Freq: Once | INTRAMUSCULAR | Status: DC

## 2021-03-30 MED ORDER — ONDANSETRON HCL 4 MG/2ML IJ SOLN: 4.0000 mg | INTRAMUSCULAR | Status: DC | PRN

## 2021-03-30 MED ORDER — SIMETHICONE 80 MG PO CHEW: 80.0000 mg | CHEWABLE_TABLET | ORAL | Status: DC | PRN

## 2021-03-30 MED ORDER — COCONUT OIL OIL
1.0000 "application " | TOPICAL_OIL | Status: DC | PRN
  Administered 2021-03-31: 1 via TOPICAL

## 2021-03-30 MED ORDER — PRENATAL MULTIVITAMIN CH
1.0000 | ORAL_TABLET | Freq: Every day | ORAL | Status: DC
  Administered 2021-03-30 – 2021-03-31 (×2): 1 via ORAL
  Filled 2021-03-30 (×2): qty 1

## 2021-03-30 MED ORDER — TRANEXAMIC ACID-NACL 1000-0.7 MG/100ML-% IV SOLN
INTRAVENOUS | Status: AC
  Administered 2021-03-30: 1000 mg
  Filled 2021-03-30: qty 100

## 2021-03-30 MED ORDER — WITCH HAZEL-GLYCERIN EX PADS: 1.0000 "application " | MEDICATED_PAD | CUTANEOUS | Status: DC | PRN

## 2021-03-30 MED ORDER — DIBUCAINE (PERIANAL) 1 % EX OINT: 1.0000 "application " | TOPICAL_OINTMENT | CUTANEOUS | Status: DC | PRN

## 2021-03-30 MED ORDER — MEASLES, MUMPS & RUBELLA VAC IJ SOLR: 0.5000 mL | Freq: Once | INTRAMUSCULAR | Status: DC

## 2021-03-30 MED ORDER — ACETAMINOPHEN 325 MG PO TABS
650.0000 mg | ORAL_TABLET | ORAL | Status: DC | PRN
  Administered 2021-03-31 (×2): 650 mg via ORAL
  Filled 2021-03-30 (×2): qty 2

## 2021-03-30 MED ORDER — IBUPROFEN 600 MG PO TABS
600.0000 mg | ORAL_TABLET | Freq: Four times a day (QID) | ORAL | Status: DC
  Administered 2021-03-30 – 2021-03-31 (×6): 600 mg via ORAL
  Filled 2021-03-30 (×6): qty 1

## 2021-03-30 NOTE — Anesthesia Postprocedure Evaluation (Signed)
Anesthesia Post Note  Patient: Michelle Carr  Procedure(s) Performed: AN AD HOC LABOR EPIDURAL     Patient location during evaluation: Mother Baby Anesthesia Type: Epidural Level of consciousness: awake and alert Pain management: pain level controlled Vital Signs Assessment: post-procedure vital signs reviewed and stable Respiratory status: spontaneous breathing, nonlabored ventilation and respiratory function stable Cardiovascular status: stable Postop Assessment: no headache, no backache, epidural receding, no apparent nausea or vomiting, patient able to bend at knees, adequate PO intake and able to ambulate Anesthetic complications: no   No complications documented.  Last Vitals:  Vitals:   03/30/21 0815 03/30/21 1145  BP: 133/78 139/86  Pulse: 87 81  Resp: 18 16  Temp: 36.9 C 36.9 C  SpO2: 99% 99%    Last Pain:  Vitals:   03/30/21 1517  TempSrc:   PainSc: 6    Pain Goal: Patients Stated Pain Goal: 4 (03/29/21 1533)                 Laban Emperor

## 2021-03-30 NOTE — Discharge Summary (Signed)
Postpartum Discharge Summary  Date of Service updated 03/31/21     Patient Name: Michelle Carr DOB: 12/06/2000 MRN: [id number]  Date of admission: 03/29/2021 Delivery date:03/30/2021  Delivering provider: Arrie Senate  Date of discharge: 03/31/2021  Admitting diagnosis: Encounter for induction of labor [Z34.90] Intrauterine pregnancy: [redacted]w[redacted]d     Secondary diagnosis:  Active Problems:   Obesity   Supervision of normal first pregnancy   Encounter for induction of labor   Vaginal delivery   [name] (intrauterine device) in place  Additional problems: none    Discharge diagnosis: Term Pregnancy Delivered                                              Post partum procedures:post placental liletta placed Augmentation: Pitocin and IP Foley Complications: None  Hospital course: Induction of Labor With Vaginal Delivery   20 y.o. yo G1P0 at 100w5d was admitted to the hospital 03/29/2021 for induction of labor.  Indication for induction: NRNST.  Patient had an uncomplicated labor course as follows: Membrane Rupture Time/Date: 11:15 PM ,03/29/2021   Delivery Method:Vaginal, Spontaneous  Episiotomy: None  Lacerations:  Sulcus  Details of delivery can be found in separate delivery note.  Patient had a routine postpartum course. Patient is discharged home 03/31/21.  Newborn Data: Birth date:03/30/2021  Birth time:4:39 AM  Gender:Female  Living status:Living  Apgars:8 ,9  Weight:3810 g   Magnesium Sulfate received: No BMZ received: No Rhophylac:N/A MMR:N/A T-DaP:offer postpartum Flu: No Transfusion:No  Physical exam  Vitals:   03/30/21 1145 03/30/21 1620 03/30/21 2051 03/31/21 0442  BP: 139/86 127/63 138/80 (!) 131/95  Pulse: 81 100 92 89  Resp: $Remo'16 18 20 18  'GgcjG$ Temp: 98.4 F (36.9 C) 98.4 F (36.9 C) 98 F (36.7 C) 97.8 F (36.6 C)  TempSrc: Oral Oral Oral Oral  SpO2: 99% 100% 99% 100%  Weight:      Height:       General: alert, cooperative and no distress Lochia:  appropriate Uterine Fundus: firm Incision: N/A DVT Evaluation: No evidence of DVT seen on physical exam. Labs: Lab Results  Component Value Date   WBC 9.3 03/29/2021   HGB 12.3 03/29/2021   HCT 35.3 (L) 03/29/2021   MCV 85.5 03/29/2021   PLT 255 03/29/2021   CMP Latest Ref Rng & Units 07/20/2020  Glucose 70 - 99 mg/dL 91  BUN 6 - 20 mg/dL 8  Creatinine 0.44 - 1.00 mg/dL 0.65  Sodium 135 - 145 mmol/L 137  Potassium 3.5 - 5.1 mmol/L 3.7  Chloride 98 - 111 mmol/L 103  CO2 22 - 32 mmol/L 25  Calcium 8.9 - 10.3 mg/dL 8.7(L)  Total Protein 6.5 - 8.1 g/dL 7.5  Total Bilirubin 0.3 - 1.2 mg/dL 0.1(L)  Alkaline Phos 38 - 126 U/L 69  AST 15 - 41 U/L 17  ALT 0 - 44 U/L 14   Edinburgh Score: Edinburgh Postnatal Depression Scale Screening Tool 03/30/2021  I have been able to laugh and see the funny side of things. (No Data)     After visit meds:  Allergies as of 03/31/2021      Reactions   Shellfish Allergy Swelling      Medication List    STOP taking these medications   aspirin EC 81 MG tablet   cyclobenzaprine 10 MG tablet Commonly known as: FLEXERIL  TAKE these medications   acetaminophen 500 MG tablet Commonly known as: TYLENOL Take 2 tablets (1,000 mg total) by mouth every 6 (six) hours as needed for up to 30 doses for mild pain. What changed:   medication strength  how much to take  when to take this   amLODipine 5 MG tablet Commonly known as: NORVASC Take 1 tablet (5 mg total) by mouth daily.   Blood Pressure Kit Devi 1 kit by Does not apply route once a week. Check Blood Pressure regularly and record readings into the Babyscripts App.  Large Cuff.  DX O90.0   ibuprofen 600 MG tablet Commonly known as: ADVIL Take 1 tablet (600 mg total) by mouth every 6 (six) hours as needed.   Vitafol-Nano 18-0.6-0.4 MG Tabs Take 1 tablet by mouth daily.        Discharge home in stable condition Infant Feeding: Breast Infant Disposition:home with  mother Discharge instruction: per After Visit Summary and Postpartum booklet. Activity: Advance as tolerated. Pelvic rest for 6 weeks.  Diet: routine diet Future Appointments: Future Appointments  Date Time Provider [place]  04/27/2021  1:10 PM Rasch, Artist Pais, NP [place] None   Follow up Visit: Message sent to [facility] 03/30/21 by Sylvester Harder.   Please schedule this patient for a In person postpartum visit in 6 weeks with the following provider: Any provider. Additional Postpartum F/U:BP check 1 week  Low risk pregnancy complicated by: n/a Delivery mode:  Vaginal, Spontaneous  Anticipated Birth Control:  PP [name] placed, string check at postpartum visit   12/30/19  Arrie Senate, MD Arrie Senate, MD

## 2021-03-30 NOTE — Lactation Note (Signed)
This note was copied from a baby's chart. Lactation Consultation Note  Patient Name: Michelle Carr Today's Date: 03/30/2021   Age:21 hours  Initial visit at 3 hours of life. Mom reports that infant has not fed, yet, but they have seen feeding cues. Mom reports + breast changes w/pregnancy.   Mom's nipples appear flat & retract with stimulation. Mom was placed in side-lying position to better accentuate nipple. Infant had a few sucks on nipple with assistance of teacup hold.   Hand expression was done with Mom & colostrum is very easily expressed. "Glendora Score" was spoon-fed until content. A 2nd attempt at the breast was done mid-way through spoon-feeding as infant was showing more interest in latching, but she could not properly latch.  Mom fell asleep during hand expression and spoon-feeding of infant. I did not get an opportunity to review the breastfeeding brochure.   Lurline Hare [facility] 03/30/2021, 8:13 AM

## 2021-03-30 NOTE — Progress Notes (Addendum)
Labor Progress Note Michelle Carr is a 20 y.o. G1P0 at 100w4d presented for NRNST. Upon admission had prolonged FHR decel. CST was negative.   S:  Comfortable, has mild pressure in pelvis with contractions.   O:  BP (!) 112/58   Pulse 92   Temp 98.6 F (37 C) (Oral)   Resp 16   Ht 5\' 7"  (1.702 m)   Wt 115.2 kg   LMP 05/29/2020   SpO2 99%   BMI 39.78 kg/m  EFM: baseline 135 bpm/ mod variability/ + accels/ witnessed prolonged late decel Toco: every 2-3 minutes  SVE:  Dilation: 10  Effacement: 100  Station: -1   A/P: 20 y.o. G1P0 [redacted]w[redacted]d  1. Labor: Active labor, now complete with SROM @2300 . Will allow for fetal head descent. Observed prolonged decel that improved with repositioning. FSE placed.   2. FWB: Prolonged decel while in the room, has returned to cat I tracing. 3. Pain: epidural  4. MOD: anticipate VD   [redacted]w[redacted]d, Medical Student 12:22 AM  GME ATTESTATION:  I saw and evaluated the patient. I agree with the findings and the plan of care as documented in the medical student's note.  , MD OB Fellow, Faculty [facility], Center for [facility] 03/30/2021 12:29 AM

## 2021-03-30 NOTE — Procedures (Signed)
  Post-Placental [name] Insertion Procedure Note  Patient identified, informed consent signed prior to delivery, signed copy in chart, time out was performed.    Vaginal, labial and perineal areas thoroughly inspected for lacerations. Left sulcal laceration identified - not hemostatic, not repaired prior to insertion of [name].   [name]  - [name] grasped between sterile gloved fingers. Sterile lubrication applied to sterile gloved hand for ease of insertion. Fundus identified through abdominal wall using non-insertion hand. [name] inserted to fundus with bimanual technique. [name] carefully released at the fundus and insertion hand gently removed from vagina.   Patient given post procedure instructions and [name] care card with expiration date.  Patient is asked to keep [name] strings tucked in her vagina until her postpartum follow up visit in 4-6 weeks. Patient advised to abstain from sexual intercourse and pulling on strings before her follow-up visit. Patient verbalized an understanding of the plan of care and agrees.   Alric Seton, MD OB Fellow, Faculty [facility], Center for [facility] 03/30/2021 5:19 AM

## 2021-03-31 ENCOUNTER — Other Ambulatory Visit (HOSPITAL_COMMUNITY): Payer: Self-pay

## 2021-03-31 MED ORDER — ACETAMINOPHEN 500 MG PO TABS: 1000.0000 mg | ORAL_TABLET | Freq: Four times a day (QID) | ORAL | Status: DC | PRN

## 2021-03-31 MED ORDER — AMLODIPINE BESYLATE 5 MG PO TABS
5.0000 mg | ORAL_TABLET | Freq: Every day | ORAL | 3 refills | Status: DC
  Filled 2021-03-31: qty 30, 30d supply, fill #0

## 2021-03-31 MED ORDER — AMLODIPINE BESYLATE 5 MG PO TABS
5.0000 mg | ORAL_TABLET | Freq: Every day | ORAL | Status: DC
  Administered 2021-03-31: 5 mg via ORAL
  Filled 2021-03-31: qty 1

## 2021-03-31 MED ORDER — IBUPROFEN 600 MG PO TABS: 600.0000 mg | ORAL_TABLET | Freq: Four times a day (QID) | ORAL | 0 refills | Status: DC | PRN

## 2021-03-31 NOTE — Discharge Instructions (Signed)
-take tylenol 1000 mg every 6 hours as needed for pain, alternate with ibuprofen 600 mg every 6 hours -drink plenty of water to help with breastfeeding -continue prenatal vitamins while you are breastfeeding -take iron pills every other day with vitamin c, this will help healing as well as breast feeding -think about birth control options-->bedisider.org is a great website! You can get any form of birth control from the health department for free   Postpartum Care After Vaginal Delivery The following information offers guidance about how to care for yourself from the time you deliver your baby to 6-12 weeks after delivery (postpartum period). If you have problems or questions, contact your health care provider for more specific instructions. Follow these instructions at home: Vaginal bleeding  It is normal to have vaginal bleeding (lochia) after delivery. Wear a sanitary pad for bleeding and discharge. ? During the first week after delivery, the amount and appearance of lochia is often similar to a menstrual period. ? Over the next few weeks, it will gradually decrease to a dry, yellow-brown discharge. ? For most women, lochia stops completely by 4-6 weeks after delivery, but can vary.  Change your sanitary pads frequently. Watch for any changes in your flow, such as: ? A sudden increase in volume. ? A change in color. ? Large blood clots.  If you pass a blood clot from your vagina, save it and call your health care provider. Do not flush blood clots down the toilet before talking with your health care provider.  Do not use tampons or douches until your health care provider approves.  If you are not breastfeeding, your period should return 6-8 weeks after delivery. If you are feeding your baby breast milk only, your period may not return until you stop breastfeeding. Perineal care  Keep the area between the vagina and the anus (perineum) clean and dry. Use medicated pads and  pain-relieving sprays and creams as directed.  If you had a surgical cut in the perineum (episiotomy) or a tear, check the area for signs of infection until you are healed. Check for: ? More redness, swelling, or pain. ? Fluid or blood coming from the cut or tear. ? Warmth. ? Pus or a bad smell.  You may be given a squirt bottle to use instead of wiping to clean the perineum area after you use the bathroom. Pat the area gently to dry it.  To relieve pain caused by an episiotomy, a tear, or swollen veins in the anus (hemorrhoids), take a warm sitz bath 2-3 times a day. In a sitz bath, the warm water should only come up to your hips and cover your buttocks.   Breast care  In the first few days after delivery, your breasts may feel heavy, full, and uncomfortable (breast engorgement). Milk may also leak from your breasts. Ask your health care provider about ways to help relieve the discomfort.  If you are breastfeeding: ? Wear a bra that supports your breasts and fits well. Use breast pads to absorb milk that leaks. ? Keep your nipples clean and dry. Apply creams and ointments as told. ? You may have uterine contractions every time you breastfeed for up to several weeks after delivery. This helps your uterus return to its normal size. ? If you have any problems with breastfeeding, notify your health care provider or lactation consultant.  If you are not breastfeeding: ? Avoid touching your breasts. Do not squeeze out (express) milk. Doing this can make your   breasts produce more milk. ? Wear a good-fitting bra and use cold packs to help with swelling. Intimacy and sexuality  Ask your health care provider when you can engage in sexual activity. This may depend upon: ? Your risk of infection. ? How fast you are healing. ? Your comfort and desire to engage in sexual activity.  You are able to get pregnant after delivery, even if you have not had your period. Talk with your health care provider  about methods of birth control (contraception) or family planning if you desire future pregnancies. Medicines  Take over-the-counter and prescription medicines only as told by your health care provider.  Take an over-the-counter stool softener to help ease bowel movements as told by your health care provider.  If you were prescribed an antibiotic medicine, take it as told by your health care provider. Do not stop taking the antibiotic even if you start to feel better.  Review all previous and current prescriptions to check for possible transfer into breast milk. Activity  Gradually return to your normal activities as told by your health care provider.  Rest as much as possible. Nap while your baby is sleeping. Eating and drinking  Drink enough fluid to keep your urine pale yellow.  To help prevent or relieve constipation, eat high-fiber foods every day.  Choose healthy eating to support breastfeeding or weight loss goals.  Take your prenatal vitamins until your health care provider tells you to stop.   General tips/recommendations  Do not use any products that contain nicotine or tobacco. These products include cigarettes, chewing tobacco, and vaping devices, such as e-cigarettes. If you need help quitting, ask your health care provider.  Do not drink alcohol, especially if you are breastfeeding.  Do not take medications or drugs that are not prescribed to you, especially if you are breastfeeding.  Visit your health care provider for a postpartum checkup within the first 3-6 weeks after delivery.  Complete a comprehensive postpartum visit no later than 12 weeks after delivery.  Keep all follow-up visits for you and your baby. Contact a health care provider if:  You feel unusually sad or worried.  Your breasts become red, painful, or hard.  You have a fever or other signs of an infection.  You have bleeding that is soaking through one pad an hour or you have blood  clots.  You have a severe headache that doesn't go away or you have vision changes.  You have nausea and vomiting and are unable to eat or drink anything for 24 hours. Get help right away if:  You have chest pain or difficulty breathing.  You have sudden, severe leg pain.  You faint or have a seizure.  You have thoughts about hurting yourself or your baby. If you ever feel like you may hurt yourself or others, or have thoughts about taking your own life, get help right away. Go to your nearest emergency department or:  Call your local emergency services (911 in the [place]  The National Suicide Prevention Lifeline at 1-800-273-8255. This suicide crisis helpline is open 24 hours a day.  Text the Crisis Text Line at 741741 (in the [place] Summary  The period of time after you deliver your newborn up to 6-12 weeks after delivery is called the postpartum period.  Keep all follow-up visits for you and your baby.  Review all previous and current prescriptions to check for possible transfer into breast milk.  Contact a health care provider if you feel   unusually sad or worried during the postpartum period. This information is not intended to replace advice given to you by your health care provider. Make sure you discuss any questions you have with your health care provider. Document Revised: 07/30/2020 Document Reviewed: 07/30/2020 Elsevier Patient Education  2021 Elsevier Inc.  

## 2021-03-31 NOTE — Lactation Note (Signed)
This note was copied from a baby's chart. Lactation Consultation Note  Patient Name: Michelle Carr [id number] Date: 03/31/2021 Reason for consult: Follow-up assessment;Mother's request;Difficult latch Age:21 hours Infant cueing on arrival. LC assisted Mom latching baby on the left breast with signs of milk transfer.  LC did some suck training. Infant high palate and with suck training and chin tug infant able to extend tongue felt base of my finger.   Mom stated with suck training and chin tug, she did not have any pain with the latch and with breast compression could see the increase in depth with swallows.  Mom using manual pump at the hospital but has electric pump at home. LC stressed importance to use electric pump at home after latching at the breast to maintain milk supply.   Plan 1. To feed based on cues 8-12x in 24 hr period no more than 4 hrs without an attempt.          2. Mom aware to offer EBM first then formula when supplementing with yellow slow flow nipple and paced bottle feeding. Breast/formula comparison guide provided with instructions to offer half bottle burp baby and offer other half. Mom aware to offer more if infant not latching and still hungry.                 3. Mom to pump with personal electric pump q3 hrs for 15 minutes.                4. LC brochure of inpatient and outpatient services reviewed.  All questions answered at the end of the visit.   Maternal Data    Feeding Mother's Current Feeding Choice: Breast Milk and Formula Nipple Type: Slow - flow  LATCH Score Latch: Repeated attempts needed to sustain latch, nipple held in mouth throughout feeding, stimulation needed to elicit sucking reflex.  Audible Swallowing: Spontaneous and intermittent  Type of Nipple: Everted at rest and after stimulation  Comfort (Breast/Nipple): Soft / non-tender  Hold (Positioning): Assistance needed to correctly position infant at breast and maintain latch.  LATCH  Score: 8   Lactation Tools Discussed/Used Tools: Pump;Flanges Breast pump type: Manual Reason for Pumping: increase stimulation Pumping frequency: every 3 hrs for 15 minutes  Interventions Interventions: Breast feeding basics reviewed;Support pillows;Education;Assisted with latch;Position options;Skin to skin;Expressed milk;Breast massage;Hand express;Hand pump;Breast compression;Adjust position  Discharge Discharge Education: Engorgement and breast care;Warning signs for feeding baby Pump: Personal  Consult Status Consult Status: Complete Date: 04/01/21 Follow-up type: In-patient    Michelle Carr  Michelle Carr 03/31/2021, 5:10 PM

## 2021-04-01 ENCOUNTER — Inpatient Hospital Stay (HOSPITAL_COMMUNITY): Admission: AD | Admit: 2021-04-01 | Payer: Medicaid Other | Source: Home / Self Care | Admitting: Family Medicine

## 2021-04-01 ENCOUNTER — Telehealth: Payer: Self-pay

## 2021-04-01 ENCOUNTER — Inpatient Hospital Stay (HOSPITAL_COMMUNITY): Payer: Medicaid Other

## 2021-04-01 NOTE — Telephone Encounter (Signed)
Transition Care Management Follow-up Telephone Call  Date of discharge and from where: 03/31/2021 from [facility]  How have you been since you were released from the hospital? Pt stated that she is feeling well and has not questions at this time.   Any questions or concerns? No  Items Reviewed:  Did the pt receive and understand the discharge instructions provided? Yes   Medications obtained and verified? Yes   Other? No   Any new allergies since your discharge? No   Dietary orders reviewed? n/a  Do you have support at home? Yes   Functional Questionnaire: (I = Independent and D = Dependent) ADLs: I  Bathing/Dressing- I  Meal Prep- I  Eating- I  Maintaining continence- I  Transferring/Ambulation- I  Managing Meds- I   Follow up appointments reviewed:   PCP Hospital f/u appt confirmed? No    Specialist Hospital f/u appt confirmed? Yes  Scheduled to see Venia Carbon, NP on 04/27/2021 @ 1:10pm.  Are transportation arrangements needed? No   If their condition worsens, is the pt aware to call PCP or go to the Emergency Dept.? Yes  Was the patient provided with contact information for the PCP's office or ED? Yes  Was to pt encouraged to call back with questions or concerns? Yes

## 2021-04-07 ENCOUNTER — Ambulatory Visit (INDEPENDENT_AMBULATORY_CARE_PROVIDER_SITE_OTHER): Payer: Medicaid Other

## 2021-04-07 ENCOUNTER — Other Ambulatory Visit: Payer: Self-pay

## 2021-04-07 VITALS — BP 120/79 | HR 79 | Wt 225.0 lb

## 2021-04-07 DIAGNOSIS — Z0130 Encounter for examination of blood pressure without abnormal findings: Secondary | ICD-10-CM

## 2021-04-07 NOTE — Progress Notes (Signed)
Subjective:  Michelle Carr is a 20 y.o. female here for BP check s/p NSVD on 03/30/2021. [name] placed post placental  Hypertension ROS: taking medications as instructed, no medication side effects noted, no TIA's, no chest pain on exertion, no dyspnea on exertion and no swelling of ankles.   Objective:  BP 120/79   Pulse 79   Wt 225 lb (102.1 kg)   LMP 05/29/2020   BMI 35.24 kg/m   Appearance alert, well appearing, and in no distress. General exam BP noted to be well controlled today in office.    Assessment:   Blood Pressure well controlled.   Plan:  Current treatment plan is effective, no change in therapy.  Keep upcoming routine pp visit

## 2021-04-07 NOTE — Progress Notes (Signed)
Patient was assessed and managed by nursing staff during this encounter. I have reviewed the chart and agree with the documentation and plan. I have also made any necessary editorial changes.  Catalina Antigua, MD 04/07/2021 10:34 AM

## 2021-04-15 ENCOUNTER — Other Ambulatory Visit: Payer: Self-pay

## 2021-04-15 ENCOUNTER — Encounter: Payer: Self-pay | Admitting: Obstetrics

## 2021-04-15 ENCOUNTER — Ambulatory Visit (INDEPENDENT_AMBULATORY_CARE_PROVIDER_SITE_OTHER): Payer: Medicaid Other | Admitting: Obstetrics

## 2021-04-15 VITALS — BP 112/83 | HR 87 | Wt 225.0 lb

## 2021-04-15 DIAGNOSIS — Z975 Presence of (intrauterine) contraceptive device: Secondary | ICD-10-CM

## 2021-04-15 DIAGNOSIS — Z30431 Encounter for routine checking of intrauterine contraceptive device: Secondary | ICD-10-CM

## 2021-04-15 NOTE — Progress Notes (Signed)
PP delivered 03/30/21. Pt presents for problem visit today. PP is scheduled on 04/27/21.  CC: pt states she can feel [name] coming out when walking and that strings are really long. And also notes bleeding and cramping wants eval.  If [name] needs to come out pt wants to consider other [facility] options.

## 2021-04-15 NOTE — Progress Notes (Signed)
Subjective:    Michelle Carr is a 20 y.o. female who presents for contraception counseling. The patient reports that she can feel [name] strings at the introitus.  The [name] was inserted after delivery 2 weeks ago. The patient is not sexually active. Pertinent past medical history: none.  The information documented in the HPI was reviewed and verified.  Menstrual History: OB History    Gravida  1   Para  1   Term  1   Preterm      AB      Living  1     SAB      IAB      Ectopic      Multiple  0   Live Births  1            Patient's last menstrual period was 05/29/2020.   Patient Active Problem List   Diagnosis Date Noted  . Vaginal delivery 03/30/2021  . [name] (intrauterine device) in place 03/30/2021  . Encounter for induction of labor 03/29/2021  . 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 Supervision of normal first pregnancy 08/20/2020  . Morning sickness 08/20/2020  . Family history of diabetes mellitus 05/01/2019  . Obesity 05/01/2019   Past Medical History:  Diagnosis Date  . Medical history non-contributory     Past Surgical History:  Procedure Laterality Date  . NO PAST SURGERIES       Current Outpatient Medications:  .  acetaminophen (TYLENOL) 500 MG tablet, Take 2 tablets (1,000 mg total) by mouth every 6 (six) hours as needed for up to 30 doses for mild pain., Disp: , Rfl:  .  amLODipine (NORVASC) 5 MG tablet, Take 1 tablet (5 mg total) by mouth daily., Disp: 30 tablet, Rfl: 3 .  Blood Pressure Monitoring (BLOOD PRESSURE KIT) DEVI, 1 kit by Does not apply route once a week. Check Blood Pressure regularly and record readings into the Babyscripts App.  Large Cuff.  DX O90.0, Disp: 1 each, Rfl: 0 .  ibuprofen (ADVIL) 600 MG tablet, Take 1 tablet (600 mg total) by mouth every 6 (six) hours as needed., Disp: 30 tablet, Rfl: 0 .  Prenatal-Fe Fum-Methf-FA w/o A (VITAFOL-NANO) 18-0.6-0.4 MG TABS, Take 1 tablet by mouth daily. (Patient not taking:  Reported on 04/07/2021), Disp: 30 tablet, Rfl: 11 Allergies  Allergen Reactions  . Shellfish Allergy Swelling    Social History   Tobacco Use  . Smoking status: Never Smoker  . Smokeless tobacco: Never Used  Substance Use Topics  . Alcohol use: No    Family History  Problem Relation Age of Onset  . Hyperlipidemia Mother   . Hyperlipidemia Maternal Grandmother   . Diabetes Maternal Grandmother   . Hypertension Maternal Grandmother        Review of Systems Constitutional: negative for weight loss Genitourinary:negative for abnormal menstrual periods and vaginal discharge   Objective:   BP 112/83   Pulse 87   Wt 225 lb (102.1 kg)   LMP 05/29/2020   Breastfeeding Yes   BMI 35.24 kg/m    General:   alert and no distress  Skin:   no rash or abnormalities  Lungs:   clear to auscultation bilaterally  Heart:   regular rate and rhythm, S1, S2 normal, no murmur, click, rub or gallop  Breasts:   not examined  Abdomen:  normal findings: no organomegaly, soft, non-tender and no hernia  Pelvis:  External genitalia: normal general appearance Urinary system: urethral meatus normal and bladder  without fullness, nontender Vaginal: normal without tenderness, induration or masses.  [name] string protruding from vagina.  Left sulcus laceration well approximated and clean Cervix: normal appearance.  [name] strings cut ~ 4 cm from cervical os Adnexa: normal bimanual exam Uterus: anteverted and non-tender, normal size   Lab Review Urine pregnancy test Labs reviewed no Radiologic studies reviewed no  I have spent a total of 15 minutes of face-to-face time, excluding clinical staff time, reviewing notes and preparing to see patient, ordering tests and/or medications, and counseling the patient.  Assessment:    20 y.o., continuing [name], no contraindications.  [name] in place with long strings that were appropriately left long after a post delivery insertion.  Strings cut to an appropriate  length.  Plan:    All questions answered. Contraception: [name]. Diagnosis explained in detail, including differential. Follow up in 4 weeks.  Shelly Bombard, MD 04/15/2021 2:05 PM

## 2021-04-27 ENCOUNTER — Encounter: Payer: Self-pay | Admitting: Obstetrics and Gynecology

## 2021-04-27 ENCOUNTER — Ambulatory Visit (INDEPENDENT_AMBULATORY_CARE_PROVIDER_SITE_OTHER): Payer: Medicaid Other | Admitting: Obstetrics and Gynecology

## 2021-04-27 ENCOUNTER — Other Ambulatory Visit: Payer: Self-pay

## 2021-04-27 DIAGNOSIS — Z975 Presence of (intrauterine) contraceptive device: Secondary | ICD-10-CM | POA: Diagnosis not present

## 2021-04-27 NOTE — Progress Notes (Signed)
Post Partum Visit Note  Michelle Carr is a 20 y.o. G40P1001 female who presents for a postpartum visit. She is one month postpartum following a vaginal delivery. I have fully reviewed the prenatal and intrapartum course. The delivery was at 40.5 gestational weeks.  Anesthesia: Epidual . Postpartum course has been Michelle Carr is doing well. Baby is feeding by breast. Bleeding small amount. Bowel function is normal. Bladder function is normal. Patient is not sexually active. Contraception method is [name] . Postpartum depression screening:  negative.   The pregnancy intention screening data noted above was reviewed. Potential methods of contraception were discussed. The patient elected to proceed with [name] or IUS.    Edinburgh Postnatal Depression Scale - 04/27/21 1313      Edinburgh Postnatal Depression Scale:  In the Past 7 Days   I have been able to laugh and see the funny side of things. 0    I have looked forward with enjoyment to things. 0    I have blamed myself unnecessarily when things went wrong. 0    I have been anxious or worried for no good reason. 0    I have felt scared or panicky for no good reason. 0    Things have been getting on top of me. 0    I have been so unhappy that I have had difficulty sleeping. 0    I have felt sad or miserable. 2    I have been so unhappy that I have been crying. 0    The thought of harming myself has occurred to me. 0    Edinburgh Postnatal Depression Scale Total 2           Health Maintenance Due  Topic Date Due  . COVID-19 Vaccine (1) Never done  . HPV VACCINES (1 - 2-dose series) Never done    The following portions of the patient's history were reviewed and updated as appropriate: allergies, current medications, past family history, past medical history, past social history, past surgical history and problem list.  Review of Systems Pertinent items are noted in HPI.  Objective:  BP 106/72   Pulse 76   Wt 228 lb 12.8 oz (103.8 kg)    LMP 05/29/2020   BMI 35.84 kg/m    General:  alert and cooperative  Lungs: clear to auscultation bilaterally  Heart:  regular rate and rhythm, S1, S2 normal, no murmur, click, rub or gallop  Abdomen: soft, non-tender; bowel sounds normal; no masses,  no organomegaly   GU exam:  patient declined string check and GU exam.        Assessment:    Normal postpartum exam.  [name] placed PP Last Pap 02/05/20- Normal   Plan:   Essential components of care per ACOG recommendations:  1.  Mood and well being: Patient with negative depression screening today. Reviewed local resources for support.  - Patient tobacco use? No.   - hx of drug use? No.    2. Infant care and feeding:  -Patient currently breastmilk feeding? Yes. Discussed returning to work and pumping.  She is currently In school.  -Social determinants of health (SDOH) reviewed in EPIC.   3. Sexuality, contraception and birth spacing - Patient does not want a pregnancy in the next year.  Desired family size is 3 children.  - Reviewed forms of contraception in tiered fashion. Patient desired [name] today.   - Discussed birth spacing of 18 months  4. Sleep and fatigue -Encouraged family/partner/community support of  4 hrs of uninterrupted sleep to help with mood and fatigue  5. Physical Recovery  - Discussed patients delivery and complications. She describes her labor as good. - Patient had a Vaginal, no problems at delivery. Patient had a 1st degree laceration. Perineal healing reviewed. Patient expressed understanding - Patient has urinary incontinence? No. - Patient is not safe to resume physical and sexual activity- still having scant bleeding.   6.  Health Maintenance - HM due items addressed Yes  - Last pap smear No results found for: DIAGPAP Pap smear not done at today's visit.  -Breast Cancer screening indicated? No.   7. Chronic Disease/Pregnancy Condition follow up: None  - PCP follow up  Venia Carbon, NP Center  for [employer], [facility]

## 2021-05-11 ENCOUNTER — Other Ambulatory Visit: Payer: Self-pay

## 2021-05-11 DIAGNOSIS — B9689 Other specified bacterial agents as the cause of diseases classified elsewhere: Secondary | ICD-10-CM

## 2021-05-11 DIAGNOSIS — N760 Acute vaginitis: Secondary | ICD-10-CM

## 2021-05-11 MED ORDER — METRONIDAZOLE 500 MG PO TABS: 500.0000 mg | ORAL_TABLET | Freq: Two times a day (BID) | ORAL | 0 refills | Status: DC

## 2021-05-21 ENCOUNTER — Telehealth: Payer: Self-pay

## 2021-05-21 NOTE — Telephone Encounter (Signed)
Attempting to reach pt to come in now for an evaluation.  Received mychart message she thinks [name] is expelling.

## 2021-05-24 ENCOUNTER — Other Ambulatory Visit: Payer: Self-pay

## 2021-05-24 ENCOUNTER — Ambulatory Visit (INDEPENDENT_AMBULATORY_CARE_PROVIDER_SITE_OTHER): Payer: Medicaid Other | Admitting: Obstetrics and Gynecology

## 2021-05-24 VITALS — BP 110/70 | HR 76 | Wt 225.0 lb

## 2021-05-24 DIAGNOSIS — Z30431 Encounter for routine checking of intrauterine contraceptive device: Secondary | ICD-10-CM

## 2021-05-24 DIAGNOSIS — Z975 Presence of (intrauterine) contraceptive device: Secondary | ICD-10-CM

## 2021-05-24 NOTE — Progress Notes (Signed)
Pt states she needs [name] check, states strings are longer than before.  Pt states she is having some bleeding and cramping.

## 2021-05-24 NOTE — Progress Notes (Signed)
   Subjective:    Patient ID: Michelle Carr, female    DOB: May 22, 2001, 20 y.o.   MRN: [id number]  HPI Pt seen at [facility] office because she felt she may be expelling her device.  [name] [name] was placed post delivery.  Strings were cut in the office.  Pt notes she can see and feel the strings past her labia.  She denies any cramping or other discomfort.     Review of Systems     Objective:   Physical Exam Vitals:   05/24/21 0858  BP: 110/70  Pulse: 76   Ext genitalia:  [name] strings seen past the labia SVE:  vagina WNL, Cvx normal.  No portion of the [name] itself was seen extending past the cervix      Assessment & Plan:   1. [name] (intrauterine device) in place [name] strings cut to appropriate length, approximately 2-3 cm outside of cervix Due to lack of discomfort and no apparent expulsion, will not order pelvic ultrasound at this time  F/u prn    Warden Fillers, MD Faculty Attending, Center for [facility]

## 2021-10-26 ENCOUNTER — Ambulatory Visit: Payer: Medicaid Other | Admitting: Family Medicine

## 2021-12-23 ENCOUNTER — Ambulatory Visit (INDEPENDENT_AMBULATORY_CARE_PROVIDER_SITE_OTHER): Payer: Medicaid Other | Admitting: Obstetrics

## 2021-12-23 ENCOUNTER — Other Ambulatory Visit (HOSPITAL_COMMUNITY)
Admission: RE | Admit: 2021-12-23 | Discharge: 2021-12-23 | Disposition: A | Discharge status: Home / Self Care | Payer: Medicaid Other | Source: Ambulatory Visit | Attending: Obstetrics | Admitting: Obstetrics

## 2021-12-23 ENCOUNTER — Other Ambulatory Visit: Payer: Self-pay

## 2021-12-23 ENCOUNTER — Encounter: Payer: Self-pay | Admitting: Obstetrics

## 2021-12-23 VITALS — BP 95/68 | HR 76 | Ht 64.0 in | Wt 243.0 lb

## 2021-12-23 DIAGNOSIS — R102 Pelvic and perineal pain: Secondary | ICD-10-CM

## 2021-12-23 DIAGNOSIS — N898 Other specified noninflammatory disorders of vagina: Secondary | ICD-10-CM | POA: Insufficient documentation

## 2021-12-23 DIAGNOSIS — Z01419 Encounter for gynecological examination (general) (routine) without abnormal findings: Secondary | ICD-10-CM | POA: Insufficient documentation

## 2021-12-23 DIAGNOSIS — Z30431 Encounter for routine checking of intrauterine contraceptive device: Secondary | ICD-10-CM

## 2021-12-23 NOTE — Progress Notes (Signed)
Subjective:        Michelle Carr is a 21 y.o. female here for a routine exam.  Current complaints: Pelvic pain and vaginal discharge.    Personal health questionnaire:  Is patient [name], have a family history of breast and/or ovarian cancer: no Is there a family history of uterine cancer diagnosed at age < 67, gastrointestinal cancer, urinary tract cancer, family member who is a Field seismologist syndrome-associated carrier: no Is the patient overweight and hypertensive, family history of diabetes, personal history of gestational diabetes, preeclampsia or PCOS: no Is patient over 43, have PCOS,  family history of premature CHD under age 60, diabetes, smoke, have hypertension or peripheral artery disease:  no At any time, has a partner hit, kicked or otherwise hurt or frightened you?: no Over the past 2 weeks, have you felt down, depressed or hopeless?: no Over the past 2 weeks, have you felt little interest or pleasure in doing things?:no   Gynecologic History Patient's last menstrual period was 12/08/2021 (exact date). Contraception: [name] Last Pap: 02-05-2020. Results were: normal Last mammogram: n/a. Results were: n/a  Obstetric History OB History  Gravida Para Term Preterm AB Living  _0 SAB IAB Ectopic Multiple Live Births        0 1    # Outcome Date GA Lbr Len/2nd Weight Sex Delivery Anes PTL Lv  1 Term 03/30/21 [id number]:06 / 04:26 8 lb 6.4 oz (3.81 kg) F Vag-Spont EPI  LIV    Past Medical History:  Diagnosis Date   Medical history non-contributory     Past Surgical History:  Procedure Laterality Date   NO PAST SURGERIES       Current Outpatient Medications:    acetaminophen (TYLENOL) 500 MG tablet, Take 2 tablets (1,000 mg total) by mouth every 6 (six) hours as needed for up to 30 doses for mild pain. (Patient not taking: Reported on 04/27/2021), Disp: , Rfl:    amLODipine (NORVASC) 5 MG tablet, Take 1 tablet (5 mg total) by mouth daily. (Patient not  taking: Reported on 04/27/2021), Disp: 30 tablet, Rfl: 3   Blood Pressure Monitoring (BLOOD PRESSURE KIT) DEVI, 1 kit by Does not apply route once a week. Check Blood Pressure regularly and record readings into the [employer] App.  Large Cuff.  DX O90.0 (Patient not taking: Reported on 04/27/2021), Disp: 1 each, Rfl: 0   ibuprofen (ADVIL) 600 MG tablet, Take 1 tablet (600 mg total) by mouth every 6 (six) hours as needed. (Patient not taking: Reported on 04/27/2021), Disp: 30 tablet, Rfl: 0   levonorgestrel (MIRENA) 20 MCG/DAY [name], 1 each by Intrauterine route once., Disp: , Rfl:    metroNIDAZOLE (FLAGYL) 500 MG tablet, Take 1 tablet (500 mg total) by mouth 2 (two) times daily., Disp: 14 tablet, Rfl: 0   Prenatal-Fe Fum-Methf-FA w/o A (VITAFOL-NANO) 18-0.6-0.4 MG TABS, Take 1 tablet by mouth daily. (Patient not taking: No sig reported), Disp: 30 tablet, Rfl: 11 Allergies  Allergen Reactions   Shellfish Allergy Swelling    Social History   Tobacco Use   Smoking status: Never   Smokeless tobacco: Never  Substance Use Topics   Alcohol use: No    Family History  Problem Relation Age of Onset   Hyperlipidemia Mother    Hyperlipidemia Maternal Grandmother    Diabetes Maternal Grandmother    Hypertension Maternal Grandmother       Review of Systems  Constitutional: negative for fatigue and  weight loss Respiratory: negative for cough and wheezing Cardiovascular: negative for chest pain, fatigue and palpitations Gastrointestinal: negative for abdominal pain and change in bowel habits Musculoskeletal:negative for myalgias Neurological: negative for gait problems and tremors Behavioral/Psych: negative for abusive relationship, depression Endocrine: negative for temperature intolerance    Genitourinary: positive for vaginal discharge and pelvic painnegative for abnormal menstrual periods, genital lesions, hot flashes, sexual problems  Integument/breast: negative for breast lump, breast  tenderness, nipple discharge and skin lesion(s)    Objective:       BP 95/68    Pulse 76    Ht _0  (1.626 m)    Wt 243 lb (110.2 kg)    LMP 12/08/2021 (Exact Date)    Breastfeeding No    BMI 41.71 kg/m  General:   Alert and no distress  Skin:   no rash or abnormalities  Lungs:   clear to auscultation bilaterally  Heart:   regular rate and rhythm, S1, S2 normal, no murmur, click, rub or gallop  Breasts:   normal without suspicious masses, skin or nipple changes or axillary nodes  Abdomen:  normal findings: no organomegaly, soft, non-tender and no hernia  Pelvis:  External genitalia: normal general appearance Urinary system: urethral meatus normal and bladder without fullness, nontender Vaginal: normal without tenderness, induration or masses Cervix: normal appearance.  [name] string visible Adnexa: normal bimanual exam Uterus: anteverted and non-tender, normal size   Lab Review Urine pregnancy test Labs reviewed yes Radiologic studies reviewed yes  I have spent a total of 20 minutes of face-to-face time, excluding clinical staff time, reviewing notes and preparing to see patient, ordering tests and/or medications, and counseling the patient.   Assessment:    1. Encounter for routine gynecological examination with Papanicolaou smear of cervix Rx: - Cytology - PAP( [name])  2. Vaginal discharge Rx: - Cervicovaginal ancillary only( [name])  3. Pelvic pain Rx:- US PELVIC COMPLETE WITH TRANSVAGINAL; Future  4. [name] check up - possible malposition.  Ultrasound ordered.     Plan:    Education reviewed: calcium supplements, depression evaluation, low fat, low cholesterol diet, safe sex/STD prevention, self breast exams, and weight bearing exercise. Contraception: [name]. Follow up in: 2 weeks.    Orders Placed This Encounter  Procedures   US PELVIC COMPLETE WITH TRANSVAGINAL    Standing Status:   Future    Standing Expiration Date:   12/23/2022    Order Specific  Question:   Reason for Exam (SYMPTOM  OR DIAGNOSIS REQUIRED)    Answer:   Pelvic pain, AUB with [name] in place.    Order Specific Question:   Preferred imaging location?    Answer:   [facility]     Shelly Bombard, MD 12/23/2021 10:57 AM

## 2021-12-23 NOTE — Progress Notes (Signed)
GYN presents for [name] check; it was inserted in May 2022.  C/o vaginal bleeding, pain 6/10 x 2 months, discharge, odor, itching.  She is using [name] for [facility].  Last PAP 02/05/20

## 2021-12-27 ENCOUNTER — Encounter: Payer: Self-pay | Admitting: Obstetrics

## 2021-12-27 LAB — CYTOLOGY - PAP: Diagnosis: NEGATIVE

## 2021-12-28 ENCOUNTER — Other Ambulatory Visit: Payer: Self-pay | Admitting: Obstetrics

## 2021-12-28 DIAGNOSIS — B379 Candidiasis, unspecified: Secondary | ICD-10-CM

## 2021-12-28 LAB — CERVICOVAGINAL ANCILLARY ONLY
Bacterial Vaginitis (gardnerella): NEGATIVE
Candida Glabrata: POSITIVE — AB
Candida Vaginitis: NEGATIVE
Chlamydia: NEGATIVE
Comment: NEGATIVE
Comment: NEGATIVE
Comment: NEGATIVE
Comment: NEGATIVE
Comment: NEGATIVE
Comment: NORMAL
Neisseria Gonorrhea: NEGATIVE
Trichomonas: NEGATIVE

## 2021-12-28 MED ORDER — AZO BORIC ACID 600 MG VA SUPP: 600.0000 mg | Freq: Every day | VAGINAL | 0 refills | Status: DC

## 2022-01-04 ENCOUNTER — Ambulatory Visit: Admission: RE | Admit: 2022-01-04 | Payer: Medicaid Other | Source: Ambulatory Visit

## 2022-01-07 ENCOUNTER — Telehealth: Payer: Medicaid Other | Admitting: Obstetrics

## 2022-01-07 ENCOUNTER — Encounter: Payer: Self-pay | Admitting: Obstetrics

## 2022-02-15 ENCOUNTER — Ambulatory Visit: Admission: RE | Admit: 2022-02-15 | Payer: Medicaid Other | Source: Ambulatory Visit

## 2022-02-18 ENCOUNTER — Telehealth: Payer: Medicaid Other | Admitting: Obstetrics

## 2022-02-22 ENCOUNTER — Encounter: Payer: Self-pay | Admitting: Obstetrics

## 2022-02-23 ENCOUNTER — Inpatient Hospital Stay: Admission: RE | Admit: 2022-02-23 | Payer: Medicaid Other | Source: Ambulatory Visit

## 2022-03-01 ENCOUNTER — Ambulatory Visit: Payer: Medicaid Other

## 2022-03-02 ENCOUNTER — Ambulatory Visit
Admission: RE | Admit: 2022-03-02 | Discharge: 2022-03-02 | Disposition: A | Discharge status: Home / Self Care | Payer: Medicaid Other | Source: Ambulatory Visit | Attending: Obstetrics | Admitting: Obstetrics

## 2022-03-02 DIAGNOSIS — R102 Pelvic and perineal pain: Secondary | ICD-10-CM | POA: Insufficient documentation

## 2022-03-16 ENCOUNTER — Encounter: Payer: Self-pay | Admitting: Obstetrics

## 2022-03-24 ENCOUNTER — Ambulatory Visit: Payer: Medicaid Other | Admitting: Obstetrics

## 2022-03-29 ENCOUNTER — Ambulatory Visit (INDEPENDENT_AMBULATORY_CARE_PROVIDER_SITE_OTHER): Payer: Medicaid Other | Admitting: Obstetrics

## 2022-03-29 ENCOUNTER — Encounter: Payer: Self-pay | Admitting: Obstetrics

## 2022-03-29 DIAGNOSIS — Z975 Presence of (intrauterine) contraceptive device: Secondary | ICD-10-CM

## 2022-03-29 NOTE — Progress Notes (Signed)
21 y.o GYN presents for vaginal bleeding which has now stopped. ?

## 2022-03-29 NOTE — Progress Notes (Signed)
Subjective:  ? ? Michelle Carr is a 21 y.o. female who presents for contraception counseling. The patient has had irregular vaginal bleeding with her Mirena [name] for the past month, but yhe bleeding stopped a week ago, and now she is back to baseline state of no periods.. The patient is sexually active. Pertinent past medical history: none. ? ?The information documented in the HPI was reviewed and verified. ? ?Menstrual History: ?OB History   ? ? Gravida  ?1  ? Para  ?1  ? Term  ?1  ? Preterm  ?   ? AB  ?   ? Living  ?1  ?  ? ? SAB  ?   ? IAB  ?   ? Ectopic  ?   ? Multiple  ?0  ? Live Births  ?1  ?   ?  ?  ?  ? ?No LMP recorded. (Menstrual status: [name]). ?  ?Patient Active Problem List  ? Diagnosis Date Noted  ? Vaginal delivery 03/30/2021  ? [name] (intrauterine device) in place 03/30/2021  ? Encounter for induction of labor 03/29/2021  ? 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 Morning sickness 08/20/2020  ? Family history of diabetes mellitus 05/01/2019  ? Obesity 05/01/2019  ? ?Past Medical History:  ?Diagnosis Date  ? Medical history non-contributory   ?  ?Past Surgical History:  ?Procedure Laterality Date  ? NO PAST SURGERIES    ?  ? ?Current Outpatient Medications:  ?  acetaminophen (TYLENOL) 500 MG tablet, Take 2 tablets (1,000 mg total) by mouth every 6 (six) hours as needed for up to 30 doses for mild pain. (Patient not taking: Reported on 04/27/2021), Disp: , Rfl:  ?  amLODipine (NORVASC) 5 MG tablet, Take 1 tablet (5 mg total) by mouth daily. (Patient not taking: Reported on 04/27/2021), Disp: 30 tablet, Rfl: 3 ?  Blood Pressure Monitoring (BLOOD PRESSURE KIT) DEVI, 1 kit by Does not apply route once a week. Check Blood Pressure regularly and record readings into the Bab[employer] App.  Large Cuff.  DX O90.0 (Patient not taking: Reported on 04/27/2021), Disp: 1 each, Rfl: 0 ?  Boric Acid Vaginal (AZO BORIC ACID) 600 MG SUPP, Place 600 mg vaginally at bedtime., Disp: 14 suppository, Rfl: 0 ?  ibuprofen  (ADVIL) 600 MG tablet, Take 1 tablet (600 mg total) by mouth every 6 (six) hours as needed. (Patient not taking: Reported on 04/27/2021), Disp: 30 tablet, Rfl: 0 ?  levonorgestrel (MIRENA) 20 MCG/DAY [name], 1 each by Intrauterine route once., Disp: , Rfl:  ?  metroNIDAZOLE (FLAGYL) 500 MG tablet, Take 1 tablet (500 mg total) by mouth 2 (two) times daily., Disp: 14 tablet, Rfl: 0 ?  Prenatal-Fe Fum-Methf-FA w/o A (VITAFOL-NANO) 18-0.6-0.4 MG TABS, Take 1 tablet by mouth daily. (Patient not taking: No sig reported), Disp: 30 tablet, Rfl: 11 ?Allergies  ?Allergen Reactions  ? Shellfish Allergy Swelling  ?  ?Social History  ? ?Tobacco Use  ? Smoking status: Never  ? Smokeless tobacco: Never  ?Substance Use Topics  ? Alcohol use: No  ?  ?Family History  ?Problem Relation Age of Onset  ? Hyperlipidemia Mother   ? Hyperlipidemia Maternal Grandmother   ? Diabetes Maternal Grandmother   ? Hypertension Maternal Grandmother   ?  ? ? ? ?Review of Systems ?Constitutional: negative for weight loss ?Genitourinary:positive for abnormal menstrual periods, and negative for vaginal discharge ? ? ?Objective:  ? ?There were no vitals taken for this visit. ?  ?General:  Alert and no distress  ?Skin:   no rash or abnormalities  ?Lungs:   clear to auscultation bilaterally  ?Heart:   regular rate and rhythm, S1, S2 normal, no murmur, click, rub or gallop  ?The remainder of the physical exam deferred due to the type of encounter. ? ? ?Lab Review ?Urine pregnancy test ?Labs reviewed yes ?Radiologic studies reviewed yes ?  ?US PELVIC COMPLETE WITH TRANSVAGINAL (Accession [id number]) (Order [id number]) ?Imaging ?Date: 03/02/2022 Department: [facility] Released By: Claudius Sis Authorizing: Shelly Bombard, MD  ? ?Exam Status ? ?Status  ?Final [99]  ? ?PACS Intelerad Image Link ? ? Show images for US PELVIC COMPLETE WITH TRANSVAGINAL ?Study Result ? ?Narrative & Impression  ?CLINICAL DATA:  Pelvic pain,  abnormal uterine bleeding ?  ?EXAM: ?TRANSABDOMINAL AND TRANSVAGINAL ULTRASOUND OF PELVIS ?  ?DOPPLER ULTRASOUND OF OVARIES ?  ?TECHNIQUE: ?Both transabdominal and transvaginal ultrasound examinations of the ?pelvis were performed. Transabdominal technique was performed for ?global imaging of the pelvis including uterus, ovaries, adnexal ?regions, and pelvic cul-de-sac. ?  ?It was necessary to proceed with endovaginal exam following the ?transabdominal exam to visualize the endometrium and ovaries. Color ?and duplex Doppler ultrasound was utilized to evaluate blood flow to ?the ovaries. ?  ?COMPARISON:  02/01/2018 ?  ?FINDINGS: ?Uterus ?  ?Measurements: 4.4 x 3.6 x 5 cm = volume: 41.16 mL. No fibroids or ?other mass visualized. ?  ?Endometrium ?  ?Thickness: 4 mm. [name] is noted in the central portion of fundus/body ?of the uterus in the usual position. ?  ?Right ovary ?  ?Measurements: 4 x 2.1 x 2.5 cm = volume: 10.9 mL. There are multiple ?small follicles. Technologist observed 2.8 x 3.9 cm complex ?structure posterior to the right ovary. This finding is not ?conclusively seen in the submitted images. ?  ?Left ovary ?  ?Measurements: 4.1 x 2.2 x 1.8 cm = volume: 8.53 mL. There are ?multiple small follicles ?  ?Pulsed Doppler evaluation of both ovaries demonstrates normal ?low-resistance arterial and venous waveforms. ?  ?Other findings ?  ?There is trace amount of free fluid. ?  ?IMPRESSION: ?[name] is noted in the uterus in the usual location. There are multiple ?small cystic structures in the ovaries suggesting possible ?follicles. Trace amount of free fluid in the pelvis may be due to ?recent rupture of ovarian cyst or follicle. ?  ?Technologist observed possible 3.9 cm mixed echogenic structure ?posterior to the right ovary. This finding is not conclusively seen ?in the submitted images. If there are continued symptoms, follow-up ?CT abdomen and pelvis may be considered. ?  ?  ?Electronically Signed ?  By: Elmer Picker M.D. ?  On: 03/02/2022 16:09 ?   ? ? ? ? ?I have spent a total of 20 minutes of face-to-face time, excluding clinical staff time, reviewing notes and preparing to see patient, ordering tests and/or medications, and counseling the patient.  ? ?Assessment:  ? ? 21 y.o., continuing [name], no contraindications.  ? ?Plan:  ? ?1. [name] (intrauterine device) in place ?  ? All questions answered. ?Contraception: [name]. ?Diagnosis explained in detail, including differential. ?Discussed healthy lifestyle modifications. ?Follow up in 1 year. ? ? ?Shelly Bombard, MD ?03/29/2022 4:24 PM  ?

## 2022-04-19 ENCOUNTER — Encounter: Payer: Self-pay | Admitting: Obstetrics

## 2022-10-07 ENCOUNTER — Other Ambulatory Visit (HOSPITAL_COMMUNITY)
Admission: RE | Admit: 2022-10-07 | Discharge: 2022-10-07 | Disposition: A | Discharge status: Home / Self Care | Payer: Medicaid Other | Source: Ambulatory Visit | Attending: Obstetrics and Gynecology | Admitting: Obstetrics and Gynecology

## 2022-10-07 ENCOUNTER — Ambulatory Visit (INDEPENDENT_AMBULATORY_CARE_PROVIDER_SITE_OTHER): Payer: Medicaid Other | Admitting: *Deleted

## 2022-10-07 VITALS — BP 124/79 | HR 73

## 2022-10-07 DIAGNOSIS — Z113 Encounter for screening for infections with a predominantly sexual mode of transmission: Secondary | ICD-10-CM | POA: Insufficient documentation

## 2022-10-07 DIAGNOSIS — N898 Other specified noninflammatory disorders of vagina: Secondary | ICD-10-CM | POA: Insufficient documentation

## 2022-10-07 NOTE — Progress Notes (Signed)
SUBJECTIVE:  21 y.o. female who desires a STI screen. Denies abnormal vaginal bleeding or significant pelvic pain. Reports vaginal discharge X 1 week. No UTI symptoms. Denies history of known exposure to STD.  No LMP recorded. (Menstrual status: [name]).  OBJECTIVE:  She appears well.   ASSESSMENT:  STI Screen   PLAN:  Pt offered STI blood screening-requested GC, chlamydia, and trichomonas probe sent to lab.  Treatment: To be determined once lab results are received.  Pt follow up as needed.

## 2022-10-08 LAB — RPR: RPR Ser Ql: NONREACTIVE

## 2022-10-08 LAB — HEPATITIS B SURFACE ANTIGEN: Hepatitis B Surface Ag: NEGATIVE

## 2022-10-08 LAB — HEPATITIS C ANTIBODY: Hep C Virus Ab: NONREACTIVE

## 2022-10-08 LAB — HIV ANTIBODY (ROUTINE TESTING W REFLEX): HIV Screen 4th Generation wRfx: NONREACTIVE

## 2022-10-08 NOTE — Progress Notes (Signed)
Patient was assessed and managed by nursing staff during this encounter. I have reviewed the chart and agree with the documentation and plan. I have also made any necessary editorial changes.  Warden Fillers, MD 10/08/2022 9:23 AM

## 2022-10-10 LAB — CERVICOVAGINAL ANCILLARY ONLY
Bacterial Vaginitis (gardnerella): NEGATIVE
Candida Glabrata: NEGATIVE
Candida Vaginitis: NEGATIVE
Chlamydia: NEGATIVE
Comment: NEGATIVE
Comment: NEGATIVE
Comment: NEGATIVE
Comment: NEGATIVE
Comment: NEGATIVE
Comment: NORMAL
Neisseria Gonorrhea: NEGATIVE
Trichomonas: POSITIVE — AB

## 2022-10-12 ENCOUNTER — Other Ambulatory Visit: Payer: Self-pay | Admitting: Emergency Medicine

## 2022-10-12 DIAGNOSIS — A599 Trichomoniasis, unspecified: Secondary | ICD-10-CM

## 2022-10-12 MED ORDER — METRONIDAZOLE 500 MG PO TABS: 500.0000 mg | ORAL_TABLET | Freq: Two times a day (BID) | ORAL | 0 refills | Status: DC

## 2022-10-12 NOTE — Progress Notes (Signed)
TC to patient, discussed results. Rx sent to pharmacy.

## 2022-11-18 ENCOUNTER — Ambulatory Visit (INDEPENDENT_AMBULATORY_CARE_PROVIDER_SITE_OTHER): Payer: Medicaid Other | Admitting: *Deleted

## 2022-11-18 ENCOUNTER — Other Ambulatory Visit (HOSPITAL_COMMUNITY)
Admission: RE | Admit: 2022-11-18 | Discharge: 2022-11-18 | Disposition: A | Discharge status: Home / Self Care | Payer: Medicaid Other | Source: Ambulatory Visit | Attending: Obstetrics and Gynecology | Admitting: Obstetrics and Gynecology

## 2022-11-18 VITALS — BP 102/68 | HR 75

## 2022-11-18 DIAGNOSIS — Z113 Encounter for screening for infections with a predominantly sexual mode of transmission: Secondary | ICD-10-CM

## 2022-11-18 DIAGNOSIS — Z202 Contact with and (suspected) exposure to infections with a predominantly sexual mode of transmission: Secondary | ICD-10-CM

## 2022-11-18 DIAGNOSIS — A5403 Gonococcal cervicitis, unspecified: Secondary | ICD-10-CM

## 2022-11-18 NOTE — Progress Notes (Signed)
SUBJECTIVE:  21 y.o. female who desires a STI TOC screen. Hx of Trichomonas 10/07/22. Denies abnormal bleeding or significant pelvic pain. No UTI symptoms. Denies history of known exposure to STD. Reports small amt of vaginal discharge. No intercourse for several months.  No LMP recorded. (Menstrual status: [name]).  OBJECTIVE:  She appears well.   ASSESSMENT:  STI Screen   PLAN:  Pt offered STI blood screening-not indicated GC, chlamydia, and trichomonas probe sent to lab.  Treatment: To be determined once lab results are received.  Pt follow up as needed.

## 2022-11-22 LAB — CERVICOVAGINAL ANCILLARY ONLY
Bacterial Vaginitis (gardnerella): NEGATIVE
Candida Glabrata: NEGATIVE
Candida Vaginitis: POSITIVE — AB
Chlamydia: POSITIVE — AB
Comment: NEGATIVE
Comment: NEGATIVE
Comment: NEGATIVE
Comment: NEGATIVE
Comment: NEGATIVE
Comment: NORMAL
Neisseria Gonorrhea: POSITIVE — AB
Trichomonas: NEGATIVE

## 2022-11-23 ENCOUNTER — Encounter: Payer: Self-pay | Admitting: Obstetrics

## 2022-11-23 ENCOUNTER — Other Ambulatory Visit: Payer: Self-pay | Admitting: Emergency Medicine

## 2022-11-23 DIAGNOSIS — A749 Chlamydial infection, unspecified: Secondary | ICD-10-CM

## 2022-11-23 MED ORDER — DOXYCYCLINE HYCLATE 100 MG PO CAPS: 100.0000 mg | ORAL_CAPSULE | Freq: Two times a day (BID) | ORAL | 0 refills | Status: DC

## 2022-11-23 MED ORDER — FLUCONAZOLE 150 MG PO TABS: 150.0000 mg | ORAL_TABLET | Freq: Once | ORAL | 0 refills | Status: AC

## 2022-11-23 NOTE — Progress Notes (Signed)
Rx for Chlamydia and yeast per protocol

## 2022-11-24 ENCOUNTER — Encounter: Payer: Self-pay | Admitting: Emergency Medicine

## 2022-11-25 ENCOUNTER — Ambulatory Visit: Payer: Medicaid Other

## 2022-11-29 ENCOUNTER — Ambulatory Visit: Payer: Medicaid Other

## 2022-11-29 ENCOUNTER — Ambulatory Visit (INDEPENDENT_AMBULATORY_CARE_PROVIDER_SITE_OTHER): Payer: Medicaid Other

## 2022-11-29 VITALS — Wt 232.5 lb

## 2022-11-29 DIAGNOSIS — A549 Gonococcal infection, unspecified: Secondary | ICD-10-CM

## 2022-11-29 MED ORDER — CEFTRIAXONE SODIUM 500 MG IJ SOLR
500.0000 mg | Freq: Once | INTRAMUSCULAR | Status: AC
  Administered 2022-11-29: 500 mg via INTRAMUSCULAR

## 2022-11-29 NOTE — Progress Notes (Signed)
Pt is in the office for Rocephin injection for +GC on 11/18/22.  Administered in [place] and pt tolerated well .Marland Kitchen Administrations This Visit     cefTRIAXone (ROCEPHIN) injection 500 mg     Admin Date 11/29/2022 Action Given Dose 500 mg Route Intramuscular Administered By [place] Lovely, RN

## 2023-01-05 ENCOUNTER — Encounter: Payer: Self-pay | Admitting: Obstetrics

## 2023-01-11 ENCOUNTER — Ambulatory Visit (INDEPENDENT_AMBULATORY_CARE_PROVIDER_SITE_OTHER): Payer: Medicaid Other

## 2023-01-11 ENCOUNTER — Other Ambulatory Visit (HOSPITAL_COMMUNITY)
Admission: RE | Admit: 2023-01-11 | Discharge: 2023-01-11 | Disposition: A | Discharge status: Home / Self Care | Payer: Medicaid Other | Source: Ambulatory Visit

## 2023-01-11 VITALS — BP 106/67 | HR 81 | Ht 65.0 in | Wt 228.2 lb

## 2023-01-11 DIAGNOSIS — Z0000 Encounter for general adult medical examination without abnormal findings: Secondary | ICD-10-CM

## 2023-01-11 DIAGNOSIS — Z113 Encounter for screening for infections with a predominantly sexual mode of transmission: Secondary | ICD-10-CM | POA: Insufficient documentation

## 2023-01-11 NOTE — Progress Notes (Signed)
Pt presents for AEX and TOC. Pt requesting [name] check, pt c/o cramping and bleeding with [name] and states string are visible. Denies painful intercourse. Requesting STD testing.

## 2023-01-11 NOTE — Progress Notes (Signed)
   Subjective:     Lucilia Yanni is a 22 y.o. female here at [facility] for a routine exam.  Current complaints: [name] strings feel long.  Personal health questionnaire reviewed: yes.   [place] Office Visit from 01/11/2023 in [facility] for [facility] at [facility] Total Score 0       Health Maintenance Due  Topic Date Due   COVID-19 Vaccine (1) Never done   HPV VACCINES (1 - 2-dose series) Never done   INFLUENZA VACCINE  Never done    Risk factors for chronic health problems: Smoking: denies Alchohol/how much: denies Illicit drug use:  denies Pt BMI: Body mass index is 37.97 kg/m.   Gynecologic History No LMP recorded. (Menstrual status: [name]). Contraception: [name] Sexual health: not currently sexually active Last Pap: 12/23/2021. Results were: normal Last mammogram: n/a  Obstetric History OB History  Gravida Para Term Preterm AB Living  1 1 1     1   SAB IAB Ectopic Multiple Live Births        0 1    # Outcome Date GA Lbr Len/2nd Weight Sex Delivery Anes PTL Lv  1 Term 03/30/21 [redacted]w[redacted]d 06:06 / 04:26 8 lb 6.4 oz (3.81 kg) F Vag-Spont EPI  LIV   The following portions of the patient's history were reviewed and updated as appropriate: allergies, current medications, past family history, past medical history, past social history, past surgical history, and problem list.  Review of Systems Pertinent items are noted in HPI.    Objective:   VS reviewed, nursing note reviewed,  Constitutional: well developed, well nourished, no distress HEENT: normocephalic, thyroid without enlargement or mass HEART: RRR, no murmurs rubs/gallops RESP: clear and equal to auscultation bilaterally in all lobes  Breast Exam:  Deferred with low risks and shared decision making, discussed recommendation to start mammogram between 40-50 yo/ exam Abdomen: soft Neuro: alert and oriented x 3 Skin: warm, dry Psych: affect normal Pelvic exam: Cervix pink, visually closed,  without lesion, scant white creamy discharge, vaginal walls and external genitalia normal, [name] in place- 1 [name] string longer than the other so trimmed Bimanual exam: Cervix 0/long/high, firm, anterior, neg CMT, uterus nontender, nonenlarged, adnexa without tenderness, enlargement, or mass     Assessment/Plan:  1. Well woman exam without gynecological exam - Pap up to date - [name] string trimmed  2. Screen for STD (sexually transmitted disease) - TOC for GC/CT  - Cervicovaginal ancillary only( [place]) - HIV antibody (with reflex) - RPR - Hepatitis C Antibody - Hepatitis B Surface AntiGEN   Return in about 1 year (around 01/12/2024).   Renee Harder, CNM 10:08 AM

## 2023-01-12 LAB — RPR: RPR Ser Ql: NONREACTIVE

## 2023-01-12 LAB — CERVICOVAGINAL ANCILLARY ONLY
Bacterial Vaginitis (gardnerella): POSITIVE — AB
Candida Glabrata: NEGATIVE
Candida Vaginitis: NEGATIVE
Chlamydia: NEGATIVE
Comment: NEGATIVE
Comment: NEGATIVE
Comment: NEGATIVE
Comment: NEGATIVE
Comment: NEGATIVE
Comment: NORMAL
Neisseria Gonorrhea: NEGATIVE
Trichomonas: NEGATIVE

## 2023-01-12 LAB — HEPATITIS C ANTIBODY: Hep C Virus Ab: NONREACTIVE

## 2023-01-12 LAB — HEPATITIS B SURFACE ANTIGEN: Hepatitis B Surface Ag: NEGATIVE

## 2023-01-12 LAB — HIV ANTIBODY (ROUTINE TESTING W REFLEX): HIV Screen 4th Generation wRfx: NONREACTIVE

## 2023-01-14 ENCOUNTER — Other Ambulatory Visit: Payer: Self-pay

## 2023-01-14 MED ORDER — METRONIDAZOLE 500 MG PO TABS: 500.0000 mg | ORAL_TABLET | Freq: Two times a day (BID) | ORAL | 0 refills | Status: DC

## 2023-01-25 ENCOUNTER — Encounter: Payer: Self-pay | Admitting: Obstetrics

## 2023-02-07 ENCOUNTER — Ambulatory Visit: Payer: Medicaid Other | Admitting: Obstetrics & Gynecology

## 2023-04-12 ENCOUNTER — Ambulatory Visit (INDEPENDENT_AMBULATORY_CARE_PROVIDER_SITE_OTHER): Payer: Medicaid Other | Admitting: Family Medicine

## 2023-04-12 ENCOUNTER — Encounter: Payer: Self-pay | Admitting: Family Medicine

## 2023-04-12 VITALS — BP 104/68 | HR 88 | Temp 98.5°F | Resp 18 | Ht 65.0 in | Wt 230.6 lb

## 2023-04-12 DIAGNOSIS — D573 Sickle-cell trait: Secondary | ICD-10-CM | POA: Diagnosis not present

## 2023-04-12 DIAGNOSIS — Z7689 Persons encountering health services in other specified circumstances: Secondary | ICD-10-CM

## 2023-04-12 DIAGNOSIS — R7302 Impaired glucose tolerance (oral): Secondary | ICD-10-CM | POA: Diagnosis not present

## 2023-04-12 DIAGNOSIS — Z148 Genetic carrier of other disease: Secondary | ICD-10-CM

## 2023-04-12 DIAGNOSIS — F418 Other specified anxiety disorders: Secondary | ICD-10-CM

## 2023-04-12 DIAGNOSIS — R0789 Other chest pain: Secondary | ICD-10-CM

## 2023-04-12 DIAGNOSIS — R5382 Chronic fatigue, unspecified: Secondary | ICD-10-CM

## 2023-04-12 DIAGNOSIS — Z13220 Encounter for screening for lipoid disorders: Secondary | ICD-10-CM

## 2023-04-12 NOTE — Progress Notes (Signed)
New Patient Office Visit  Subjective    Patient ID: Michelle Carr, female    DOB: 2001-11-03  Age: 22 y.o. MRN: [id number]  CC:  Chief Complaint  Patient presents with   Establish Care    HPI [name] presents to establish care. Pt is new to me.  Pt is here for physical and check up. Hasn't had this in the last 2 years.  She reports chest pains since giving birth. Her child is 2 y.o now. She says she has pain worse when getting up from lying position and sometimes when standing. Mostly when going from lying down to sitting. She reports it feels discomfort. She says it feels like a dull deep ache. When it comes on, it lasts a few minutes. She sits on the edge of her bed and it eventually goes away. She reports it's in the center of her chest. Doesn't radiate. She denies belching or burping. It's everyday when she lies down. She says it first started 3 months after giving birth for 3 months. It resolved but has recently returned in the last 2 months. Denies SOB. No palpitations. She does have sickle cell trait. She has 2 y.o daughter. Pt had no problems with labor and delivery. Born at full term. She doesn't have the SCC trait.   Flowsheet Row Office Visit from 04/12/2023 in [facility] at [facility]  PHQ-9 Total Score 13          04/12/2023    1:41 PM 01/11/2023    9:33 AM 05/01/2019    9:37 AM  GAD 7 : Generalized Anxiety Score  Nervous, Anxious, on Edge 2 0 0  Control/stop worrying 2 0 0  Worry too much - different things 2 0 0  Trouble relaxing 2 0 0  Restless 1 0 0  Easily annoyed or irritable 3 0 0  Afraid - awful might happen 1 0 0  Total GAD 7 Score 13 0 0  Anxiety Difficulty Somewhat difficult  Not difficult at all    She reports a hx of postpartum depression. She reports she discussed with her OB that sometimes, she didn't want to be around her child. She was ready to go back to work. She says she voiced this at her 4 week postpartum check up. She  reports those thoughts comes and goes with stressors financially and family. She worries about bills, circumstances with certain situations. She does have parents that help her. She has older brother.  Outpatient Encounter Medications as of 04/12/2023  Medication Sig   [DISCONTINUED] acetaminophen (TYLENOL) 500 MG tablet Take 2 tablets (1,000 mg total) by mouth every 6 (six) hours as needed for up to 30 doses for mild pain.   [DISCONTINUED] amLODipine (NORVASC) 5 MG tablet Take 1 tablet (5 mg total) by mouth daily.   [DISCONTINUED] Blood Pressure Monitoring (BLOOD PRESSURE KIT) DEVI 1 kit by Does not apply route once a week. Check Blood Pressure regularly and record readings into the [employer] App.  Large Cuff.  DX O90.0 (Patient not taking: Reported on 04/27/2021)   [DISCONTINUED] Boric Acid Vaginal (AZO BORIC ACID) 600 MG SUPP Place 600 mg vaginally at bedtime. (Patient not taking: Reported on 11/29/2022)   [DISCONTINUED] doxycycline (VIBRAMYCIN) 100 MG capsule Take 1 capsule (100 mg total) by mouth 2 (two) times daily. (Patient not taking: Reported on 01/11/2023)   [DISCONTINUED] ibuprofen (ADVIL) 600 MG tablet Take 1 tablet (600 mg total) by mouth every 6 (six) hours as needed.   [  DISCONTINUED] levonorgestrel (MIRENA) 20 MCG/DAY [name] 1 each by Intrauterine route once.   [DISCONTINUED] metroNIDAZOLE (FLAGYL) 500 MG tablet Take 1 tablet (500 mg total) by mouth 2 (two) times daily.   [DISCONTINUED] Prenatal-Fe Fum-Methf-FA w/o A (VITAFOL-NANO) 18-0.6-0.4 MG TABS Take 1 tablet by mouth daily.   No facility-administered encounter medications on file as of 04/12/2023.    History reviewed. No pertinent past medical history.  History reviewed. No pertinent surgical history.  Family History  Problem Relation Age of Onset   Hyperlipidemia Mother    Hyperlipidemia Maternal Grandmother    Diabetes Maternal Grandmother    Hypertension Maternal Grandmother     Social History   Socioeconomic History    Marital status: Single    Spouse name: Not on file   Number of children: Not on file   Years of education: Not on file   Highest education level: Not on file  Occupational History   Occupation: Social research officer, government  Tobacco Use   Smoking status: Never    Passive exposure: Never   Smokeless tobacco: Never  Vaping Use   Vaping Use: Never used  Substance and Sexual Activity   Alcohol use: No    Comment: occassionally   Drug use: No   Sexual activity: Yes    Birth control/protection: I.U.D.  Other Topics Concern   Not on file  Social History Narrative   Not on file   Social Determinants of Health   Financial Resource Strain: Not on file  Food Insecurity: Not on file  Transportation Needs: Not on file  Physical Activity: Not on file  Stress: Not on file  Social Connections: Not on file  Intimate Partner Violence: Not on file    Review of Systems  Respiratory:  Negative for cough, hemoptysis, sputum production and shortness of breath.   Cardiovascular:  Positive for chest pain.  Gastrointestinal:  Negative for abdominal pain, constipation, diarrhea, heartburn, nausea and vomiting.  Psychiatric/Behavioral:  Positive for depression. The patient is nervous/anxious.   All other systems reviewed and are negative.       Objective    BP 104/68   Pulse 88   Temp 98.5 F (36.9 C) (Oral)   Resp 18   Ht 5\' 5"  (1.651 m)   Wt 230 lb 9.6 oz (104.6 kg)   SpO2 98%   BMI 38.37 kg/m   Physical Exam Vitals and nursing note reviewed.  Constitutional:      Appearance: She is obese.  HENT:     Head: Normocephalic and atraumatic.     Right Ear: Tympanic membrane, ear canal and external ear normal.     Left Ear: Tympanic membrane, ear canal and external ear normal.     Nose: Nose normal.     Mouth/Throat:     Mouth: Mucous membranes are moist.     Pharynx: Oropharynx is clear.  Eyes:     Conjunctiva/sclera: Conjunctivae normal.     Pupils: Pupils are equal, round, and reactive  to light.  Cardiovascular:     Rate and Rhythm: Normal rate and regular rhythm.     Pulses: Normal pulses.     Heart sounds: Normal heart sounds.  Pulmonary:     Effort: Pulmonary effort is normal.     Breath sounds: Normal breath sounds.  Abdominal:     General: Abdomen is flat. Bowel sounds are normal.  Musculoskeletal:        General: Normal range of motion.  Skin:    General: Skin is warm.  Capillary Refill: Capillary refill takes less than 2 seconds.  Neurological:     General: No focal deficit present.     Mental Status: She is alert and oriented to person, place, and time. Mental status is at baseline.  Psychiatric:        Mood and Affect: Mood normal.        Behavior: Behavior normal.        Thought Content: Thought content normal.        Judgment: Judgment normal.         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   Relevant Orders   Sickle Cell Scr   Sickle cell trait (HCC)   Other Visit Diagnoses     Encounter to establish care with new doctor    -  Primary   Chronic fatigue       Relevant Orders   TSH   T4, free   Encounter for lipid screening for cardiovascular disease       Relevant Orders   Lipid panel   Impaired glucose tolerance       Relevant Orders   CBC with Differential/Platelet   Comprehensive metabolic panel   Hemoglobin A1c   Depression with anxiety       Relevant Orders   Ambulatory referral to Behavioral Health   Other chest pain         Encounter to establish care with new doctor  Chronic fatigue -     TSH -     T4, free  Encounter for lipid screening for cardiovascular disease -     Lipid panel  Impaired glucose tolerance -     CBC with Differential/Platelet -     Comprehensive metabolic panel -     Hemoglobin A1c  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 Not linked with foods. Likely not cardiac due to lasting for 2 years on and off. Pt does report many psychosocial stressors and hx of postpartum depression. PHQ and GAD scores also confirms this. Unsure if this is causing her chest discomfort. Will start with labs and also refer to [facility] for counseling. See back for follow up in 2 weeks.    Return in about 2 weeks (around 04/26/2023) for Mood.   Suzan Slick, MD

## 2023-04-13 LAB — CBC WITH DIFFERENTIAL/PLATELET
Basophils Absolute: 0 10*3/uL (ref 0.0–0.2)
Basos: 0 %
EOS (ABSOLUTE): 0.1 10*3/uL (ref 0.0–0.4)
Eos: 1 %
Hematocrit: 41.5 % (ref 34.0–46.6)
Hemoglobin: 14 g/dL (ref 11.1–15.9)
Immature Grans (Abs): 0 10*3/uL (ref 0.0–0.1)
Immature Granulocytes: 0 %
Lymphocytes Absolute: 1.4 10*3/uL (ref 0.7–3.1)
Lymphs: 13 %
MCH: 28.4 pg (ref 26.6–33.0)
MCHC: 33.7 g/dL (ref 31.5–35.7)
MCV: 84 fL (ref 79–97)
Monocytes Absolute: 0.5 10*3/uL (ref 0.1–0.9)
Monocytes: 4 %
Neutrophils Absolute: 8.5 10*3/uL — ABNORMAL HIGH (ref 1.4–7.0)
Neutrophils: 82 %
Platelets: 287 10*3/uL (ref 150–450)
RBC: 4.93 x10E6/uL (ref 3.77–5.28)
RDW: 12.3 % (ref 11.7–15.4)
WBC: 10.5 10*3/uL (ref 3.4–10.8)

## 2023-04-13 LAB — LIPID PANEL
Chol/HDL Ratio: 4.1 ratio (ref 0.0–4.4)
Cholesterol, Total: 193 mg/dL (ref 100–199)
HDL: 47 mg/dL (ref >39)
LDL Chol Calc (NIH): 125 mg/dL — ABNORMAL HIGH (ref 0–99)
Triglycerides: 118 mg/dL (ref 0–149)
VLDL Cholesterol Cal: 21 mg/dL (ref 5–40)

## 2023-04-13 LAB — T4, FREE: Free T4: 1.1 ng/dL (ref 0.82–1.77)

## 2023-04-13 LAB — TSH: TSH: 1.02 u[IU]/mL (ref 0.450–4.500)

## 2023-04-13 LAB — COMPREHENSIVE METABOLIC PANEL
ALT: 16 IU/L (ref 0–32)
AST: 15 IU/L (ref 0–40)
Albumin/Globulin Ratio: 1.6 (ref 1.2–2.2)
Albumin: 4.5 g/dL (ref 4.0–5.0)
Alkaline Phosphatase: 104 IU/L (ref 44–121)
BUN/Creatinine Ratio: 16 (ref 9–23)
BUN: 10 mg/dL (ref 6–20)
Bilirubin Total: 0.4 mg/dL (ref 0.0–1.2)
CO2: 21 mmol/L (ref 20–29)
Calcium: 9.4 mg/dL (ref 8.7–10.2)
Chloride: 103 mmol/L (ref 96–106)
Creatinine, Ser: 0.61 mg/dL (ref 0.57–1.00)
Globulin, Total: 2.9 g/dL (ref 1.5–4.5)
Glucose: 81 mg/dL (ref 70–99)
Potassium: 4.2 mmol/L (ref 3.5–5.2)
Sodium: 142 mmol/L (ref 134–144)
Total Protein: 7.4 g/dL (ref 6.0–8.5)
eGFR: 129 mL/min/{1.73_m2} (ref >59)

## 2023-04-13 LAB — SICKLE CELL SCREEN: Sickle Cell Screen: POSITIVE — AB

## 2023-04-13 LAB — HEMOGLOBIN A1C
Est. average glucose Bld gHb Est-mCnc: 88 mg/dL
Hgb A1c MFr Bld: 4.7 % — ABNORMAL LOW (ref 4.8–5.6)

## 2023-08-02 ENCOUNTER — Encounter (HOSPITAL_COMMUNITY): Payer: Self-pay | Admitting: *Deleted

## 2023-08-02 ENCOUNTER — Ambulatory Visit (HOSPITAL_COMMUNITY)
Admission: EM | Admit: 2023-08-02 | Discharge: 2023-08-02 | Disposition: A | Discharge status: Home / Self Care | Payer: Medicaid Other | Attending: Family Medicine | Admitting: Family Medicine

## 2023-08-02 DIAGNOSIS — N898 Other specified noninflammatory disorders of vagina: Secondary | ICD-10-CM | POA: Diagnosis present

## 2023-08-02 DIAGNOSIS — J020 Streptococcal pharyngitis: Secondary | ICD-10-CM | POA: Insufficient documentation

## 2023-08-02 LAB — POCT RAPID STREP A (OFFICE): Rapid Strep A Screen: POSITIVE — AB

## 2023-08-02 MED ORDER — AMOXICILLIN 875 MG PO TABS: 875.0000 mg | ORAL_TABLET | Freq: Two times a day (BID) | ORAL | 0 refills | Status: AC

## 2023-08-02 MED ORDER — FLUCONAZOLE 150 MG PO TABS: ORAL_TABLET | ORAL | 0 refills | Status: DC

## 2023-08-02 NOTE — ED Provider Notes (Signed)
[facility]   [id number] 08/02/23 Arrival Time: 1510  ASSESSMENT & PLAN:  1. Strep throat   2. Vaginal discharge    No signs of peritonsillar abscess.  Meds ordered this encounter  Medications   amoxicillin (AMOXIL) 875 MG tablet    Sig: Take 1 tablet (875 mg total) by mouth 2 (two) times daily for 10 days.    Dispense:  20 tablet    Refill:  0   fluconazole (DIFLUCAN) 150 MG tablet    Sig: Take one tablet by mouth as a single dose. May repeat in 3 days if symptoms persist.    Dispense:  2 tablet    Refill:  0    Results for orders placed or performed during the hospital encounter of 08/02/23  POCT rapid strep A  Result Value Ref Range   Rapid Strep A Screen Positive (A) Negative   Labs Reviewed  POCT RAPID STREP A (OFFICE) - Abnormal; Notable for the following components:      Result Value   Rapid Strep A Screen Positive (*)    All other components within normal limits  CERVICOVAGINAL ANCILLARY ONLY   Vaginal cytology pending; will tx for yeast infection.  Instructed to finish full 10 day course of antibiotics. Will follow up if not showing significant improvement over the next 24-48 hours.  Reviewed expectations re: course of current medical issues. Questions answered. Outlined signs and symptoms indicating need for more acute intervention. Patient verbalized understanding. After Visit Summary given.   SUBJECTIVE:  Michelle Carr is a 22 y.o. female who reports a sore throat; abrupt onset; x 24 hours. Tolerating PO intake but trouble swallowing. Also with vaginal discharge; thinks yeast infection. Denies abd/pelvic pain.   OBJECTIVE:  Vitals:   08/02/23 1604  BP: 107/70  Pulse: 98  Resp: 16  Temp: 98.9 F (37.2 C)  SpO2: 94%     General appearance: alert; no distress HEENT: throat with marked erythema and with exudative tonsillar hypertrophy; uvula is midline Neck: supple with FROM; small bilat LAD Lungs: speaks full sentences without  difficulty; unlabored Abd: soft; non-tender Skin: reveals no rash; warm and dry Psychological: alert and cooperative; normal mood and affect  Allergies  Allergen Reactions   Shellfish Allergy Swelling    History reviewed. No pertinent past medical history. Social History   Socioeconomic History   Marital status: Single    Spouse name: Not on file   Number of children: Not on file   Years of education: Not on file   Highest education level: Not on file  Occupational History   Occupation: Social research officer, government  Tobacco Use   Smoking status: Never    Passive exposure: Never   Smokeless tobacco: Never  Vaping Use   Vaping status: Never Used  Substance and Sexual Activity   Alcohol use: No    Comment: occassionally   Drug use: No   Sexual activity: Yes    Birth control/protection: I.U.D.  Other Topics Concern   Not on file  Social History Narrative   Not on file   Social Determinants of Health   Financial Resource Strain: Not on file  Food Insecurity: Not on file  Transportation Needs: Not on file  Physical Activity: Not on file  Stress: Not on file  Social Connections: Not on file  Intimate Partner Violence: Not on file   Family History  Problem Relation Age of Onset   Hyperlipidemia Mother    Hyperlipidemia Maternal Grandmother    Diabetes Maternal  Grandmother    Hypertension Maternal Dulce Sellar, MD 08/02/23 [phone]

## 2023-08-02 NOTE — ED Triage Notes (Signed)
Sever sore throat with Lt ear pain. Pt also has a yeast infection. Pt has Vag discharge.

## 2023-08-03 LAB — CERVICOVAGINAL ANCILLARY ONLY
Bacterial Vaginitis (gardnerella): NEGATIVE
Candida Glabrata: NEGATIVE
Candida Vaginitis: NEGATIVE
Chlamydia: NEGATIVE
Comment: NEGATIVE
Comment: NEGATIVE
Comment: NEGATIVE
Comment: NEGATIVE
Comment: NEGATIVE
Comment: NORMAL
Neisseria Gonorrhea: NEGATIVE
Trichomonas: NEGATIVE

## 2023-08-05 ENCOUNTER — Emergency Department (HOSPITAL_BASED_OUTPATIENT_CLINIC_OR_DEPARTMENT_OTHER)
Admission: EM | Admit: 2023-08-05 | Discharge: 2023-08-06 | Disposition: A | Discharge status: Home / Self Care | Payer: Medicaid Other | Attending: Emergency Medicine | Admitting: Emergency Medicine

## 2023-08-05 DIAGNOSIS — N898 Other specified noninflammatory disorders of vagina: Secondary | ICD-10-CM | POA: Diagnosis present

## 2023-08-05 LAB — URINALYSIS, ROUTINE W REFLEX MICROSCOPIC
Bilirubin Urine: NEGATIVE
Glucose, UA: NEGATIVE mg/dL
Ketones, ur: NEGATIVE mg/dL
Nitrite: NEGATIVE
Protein, ur: NEGATIVE mg/dL
Specific Gravity, Urine: 1.016 (ref 1.005–1.030)
pH: 5 (ref 5.0–8.0)

## 2023-08-05 LAB — WET PREP, GENITAL
Clue Cells Wet Prep HPF POC: NONE SEEN
Sperm: NONE SEEN
Trich, Wet Prep: NONE SEEN
WBC, Wet Prep HPF POC: >=10 — AB (ref <10)
Yeast Wet Prep HPF POC: NONE SEEN

## 2023-08-05 LAB — PREGNANCY, URINE: Preg Test, Ur: NEGATIVE

## 2023-08-05 NOTE — ED Triage Notes (Signed)
Pt to triage c/o vaginal pain 8/10 burning itching x 5 days. Pt denies dysuria and states "the pain is on the outside" Pt VSS NAD PT on room air. Pt denies fever chills.

## 2023-08-05 NOTE — Discharge Instructions (Addendum)
Were seen in the emergency department for your vaginal pain and irritation.  You did have a small tear in your vaginal canal which is likely causing your symptoms.  You should apply lubrication to help with the pain and you can take Tylenol or Motrin as needed for pain.  I would avoid sexual intercourse or any other foreign body in your vagina until your tear has healed.  You can follow-up with your GYN to have your symptoms rechecked.  You should return to the emergency department if you are having fevers, you are having heavy vaginal bleeding or if you have any other new or concerning symptoms.

## 2023-08-05 NOTE — ED Provider Notes (Signed)
[facility] EMERGENCY DEPARTMENT AT [facility] Provider Note   CSN: [id number] Arrival date & time: 08/05/23  2015     History  Chief Complaint  Patient presents with   Vaginal Itching    Michelle Carr is a 22 y.o. female.  Patient is a 22 year old female with no significant past medical history presenting to the emergency department with vaginal irritation.  The patient states that she has had pain and swelling around the opening of her vagina since Wednesday.  She states that the area burns when her urine hits it but she does not have burning as the urine leaves her urethra.  She states that she did have a small amount of blood on her toilet paper when she wiped today.  She denies any vaginal discharge.  She denies any recent intercourse or other foreign body in her vaginal canal.  She denies any rashes.  The history is provided by the patient.  Vaginal Itching       Home Medications Prior to Admission medications   Medication Sig Start Date End Date Taking? Authorizing Provider  amoxicillin (AMOXIL) 875 MG tablet Take 1 tablet (875 mg total) by mouth 2 (two) times daily for 10 days. 08/02/23 08/12/23  Mardella Layman, MD  fluconazole (DIFLUCAN) 150 MG tablet Take one tablet by mouth as a single dose. May repeat in 3 days if symptoms persist. 08/02/23   Mardella Layman, MD      Allergies    Shellfish allergy    Review of Systems   Review of Systems  Physical Exam Updated Vital Signs BP 116/75   Pulse 85   Temp 98.8 F (37.1 C) (Oral)   Resp 20   Wt 106.6 kg   LMP 07/10/2023 (Exact Date) Comment: menses irregular becaus eof [name]  SpO2 99%   BMI 39.11 kg/m  Physical Exam Vitals and nursing note reviewed. Exam conducted with a chaperone present.  Constitutional:      General: She is not in acute distress.    Appearance: Normal appearance.  HENT:     Head: Normocephalic and atraumatic.     Nose: Nose normal.     Mouth/Throat:     Mouth: Mucous membranes are  moist.     Pharynx: Oropharynx is clear.  Eyes:     Extraocular Movements: Extraocular movements intact.     Conjunctiva/sclera: Conjunctivae normal.  Cardiovascular:     Rate and Rhythm: Normal rate and regular rhythm.     Heart sounds: Normal heart sounds.  Pulmonary:     Effort: Pulmonary effort is normal.     Breath sounds: Normal breath sounds.  Abdominal:     General: Abdomen is flat.     Palpations: Abdomen is soft.     Tenderness: There is no abdominal tenderness.  Genitourinary:    General: Normal vulva.     Comments: ~49mm vaginal wall tear on the L vaginal wall near vaginal opening, no active bleeding No significant discharge No blood in the vault No CMT or adnexal tenderness Musculoskeletal:        General: Normal range of motion.     Cervical back: Normal range of motion.  Skin:    General: Skin is warm and dry.  Neurological:     General: No focal deficit present.     Mental Status: She is alert and oriented to person, place, and time.  Psychiatric:        Mood and Affect: Mood normal.  Behavior: Behavior normal.     ED Results / Procedures / Treatments   Labs (all labs ordered are listed, but only abnormal results are displayed) Labs Reviewed  WET PREP, GENITAL - Abnormal; Notable for the following components:      Result Value   WBC, Wet Prep HPF POC >=10 (*)    All other components within normal limits  URINALYSIS, ROUTINE W REFLEX MICROSCOPIC - Abnormal; Notable for the following components:   APPearance HAZY (*)    Hgb urine dipstick SMALL (*)    Leukocytes,Ua MODERATE (*)    Bacteria, UA RARE (*)    All other components within normal limits  PREGNANCY, URINE  RPR  HIV ANTIBODY (ROUTINE TESTING W REFLEX)  GC/CHLAMYDIA PROBE AMP ([facility]) NOT AT [facility]    EKG None  Radiology No results found.  Procedures Procedures    Medications Ordered in ED Medications - No data to display  ED Course/ Medical Decision Making/  A&P Clinical Course as of 08/05/23 2318  Sat Aug 05, 2023  2258 WBC on wet prep, but negative for BV, yeast or trichomonads.  [VK]  2259 Moderate leuks but multiple squams on urine making UTI unlikely. Patient recommended lubricants for symptomatic management and avoiding any vaginal foreign body with GYN follow up. [VK]    Clinical Course User Index [VK] Rexford Maus, DO                                 Medical Decision Making This patient presents to the ED with chief complaint(s) of vaginal pain with no pertinent past medical history which further complicates the presenting complaint. The complaint involves an extensive differential diagnosis and also carries with it a high risk of complications and morbidity.    The differential diagnosis includes pregnancy, STI, BV, yeast, tear, rash  Additional history obtained: Additional history obtained from N/A Records reviewed urgent care records  ED Course and Reassessment: On patient's evaluation she was hemodynamically stable in no acute distress.  She did have a small tear noted in her vaginal canal on pelvic exam without active bleeding and is very minimal and did not require any suturing.  She was recommended symptomatic management with lubrication.  Urine and repeat STI testing were performed without any obvious STI.  She declined prophylactic treatment.  She is stable for discharge home with outpatient GYN follow-up and was given strict return precautions.  Independent labs interpretation:  The following labs were independently interpreted: Wet prep negative for yeast, trichomonads or clue cells, urine with small blood, no signs of UTI  Independent visualization of imaging: -N/A  Consultation: - Consulted or discussed management/test interpretation w/ external professional: N/A  Consideration for admission or further workup: Patient has no emergent conditions requiring admission or further work-up at this time and is stable  for discharge home with primary care/GYN follow-up  Social Determinants of health: N/A    Amount and/or Complexity of Data Reviewed Labs: ordered.          Final Clinical Impression(s) / ED Diagnoses Final diagnoses:  Vaginal lesion    Rx / DC Orders ED Discharge Orders     None         Rexford Maus, DO 08/05/23 2318

## 2023-08-07 LAB — GC/CHLAMYDIA PROBE AMP (~~LOC~~) NOT AT ARMC
Chlamydia: NEGATIVE
Comment: NEGATIVE
Comment: NORMAL
Neisseria Gonorrhea: NEGATIVE

## 2023-11-07 ENCOUNTER — Ambulatory Visit: Payer: Medicaid Other | Admitting: Obstetrics

## 2023-11-08 ENCOUNTER — Ambulatory Visit: Payer: Medicaid Other | Admitting: Obstetrics and Gynecology

## 2023-11-08 ENCOUNTER — Encounter: Payer: Self-pay | Admitting: Obstetrics and Gynecology

## 2023-11-08 ENCOUNTER — Other Ambulatory Visit (HOSPITAL_COMMUNITY)
Admission: RE | Admit: 2023-11-08 | Discharge: 2023-11-08 | Disposition: A | Discharge status: Home / Self Care | Payer: Medicaid Other | Source: Ambulatory Visit | Attending: Obstetrics and Gynecology | Admitting: Obstetrics and Gynecology

## 2023-11-08 VITALS — BP 99/64 | HR 82 | Ht 65.0 in | Wt 235.0 lb

## 2023-11-08 DIAGNOSIS — Z01411 Encounter for gynecological examination (general) (routine) with abnormal findings: Secondary | ICD-10-CM | POA: Diagnosis not present

## 2023-11-08 DIAGNOSIS — R87610 Atypical squamous cells of undetermined significance on cytologic smear of cervix (ASC-US): Secondary | ICD-10-CM | POA: Diagnosis not present

## 2023-11-08 DIAGNOSIS — Z30431 Encounter for routine checking of intrauterine contraceptive device: Secondary | ICD-10-CM | POA: Diagnosis not present

## 2023-11-08 DIAGNOSIS — Z113 Encounter for screening for infections with a predominantly sexual mode of transmission: Secondary | ICD-10-CM | POA: Insufficient documentation

## 2023-11-08 DIAGNOSIS — Z1151 Encounter for screening for human papillomavirus (HPV): Secondary | ICD-10-CM | POA: Insufficient documentation

## 2023-11-08 DIAGNOSIS — Z124 Encounter for screening for malignant neoplasm of cervix: Secondary | ICD-10-CM | POA: Insufficient documentation

## 2023-11-08 NOTE — Progress Notes (Signed)
22 yo P1 with amenorrhea secondary to Mirena [name] and BMI 39 who is here for [name] check. Patient reports doing well and is without complaints. She is concerned that the [name] may have shifted as she is able to feel the strings. She denies any pain or discomfort. She is sexually active without complaints.   History reviewed. No pertinent past medical history. History reviewed. No pertinent surgical history. Family History  Problem Relation Age of Onset   Hyperlipidemia Mother    Hyperlipidemia Maternal Grandmother    Diabetes Maternal Grandmother    Hypertension Maternal Grandmother    Social History   Tobacco Use   Smoking status: Never    Passive exposure: Never   Smokeless tobacco: Never  Vaping Use   Vaping status: Never Used  Substance Use Topics   Alcohol use: No    Comment: occassionally   Drug use: No   ROS See pertinent in HPI. All other systems reviewed and non contributory Blood pressure 99/64, pulse 82, height 5\' 5"  (1.651 m), weight 235 lb (106.6 kg). GENERAL: Well-developed, well-nourished female in no acute distress.  ABDOMEN: Soft, nontender, nondistended. No organomegaly. PELVIC: Normal external female genitalia. Vagina is pink and rugated.  Normal discharge. Normal appearing cervix with [name] strings visualized at the os and curled into the posterior fornix. Uterus is normal in size. No adnexal mass or tenderness. Chaperone present during the pelvic exam EXTREMITIES: No cyanosis, clubbing, or edema, 2+ distal pulses.  A/P 22 yo here for [name] check - Reassurance provided regarding [name] location - pap smear collected for cervical cancer screening - Patient desires STI screening via vaginal swab only. She reports normal blood work last month - Patient will be contacted with abnormal results - Information provided on perineal care to minimize recurrent BV infections

## 2023-11-08 NOTE — Progress Notes (Signed)
22 y.o. presents for [name] String check, Pt c/o feeling the strings, cramping, vaginal odor.

## 2023-11-09 LAB — CERVICOVAGINAL ANCILLARY ONLY
Chlamydia: NEGATIVE
Comment: NEGATIVE
Comment: NORMAL
Neisseria Gonorrhea: NEGATIVE

## 2023-11-10 LAB — CYTOLOGY - PAP
Comment: NEGATIVE
Diagnosis: UNDETERMINED — AB
High risk HPV: POSITIVE — AB

## 2023-11-14 ENCOUNTER — Encounter: Payer: Self-pay | Admitting: Obstetrics and Gynecology

## 2023-11-16 ENCOUNTER — Encounter: Payer: Self-pay | Admitting: Obstetrics and Gynecology

## 2024-05-08 ENCOUNTER — Encounter: Admitting: Family Medicine

## 2024-05-29 ENCOUNTER — Encounter: Payer: Self-pay | Admitting: Family Medicine

## 2024-05-29 ENCOUNTER — Ambulatory Visit: Admitting: Family Medicine

## 2024-05-29 VITALS — BP 103/62 | HR 85 | Temp 98.2°F | Resp 18 | Ht 65.0 in | Wt 246.5 lb

## 2024-05-29 DIAGNOSIS — Z0000 Encounter for general adult medical examination without abnormal findings: Secondary | ICD-10-CM

## 2024-05-29 DIAGNOSIS — R7302 Impaired glucose tolerance (oral): Secondary | ICD-10-CM

## 2024-05-29 DIAGNOSIS — Z136 Encounter for screening for cardiovascular disorders: Secondary | ICD-10-CM

## 2024-05-29 DIAGNOSIS — Z6841 Body Mass Index (BMI) 40.0 and over, adult: Secondary | ICD-10-CM

## 2024-05-29 DIAGNOSIS — Z23 Encounter for immunization: Secondary | ICD-10-CM

## 2024-05-29 DIAGNOSIS — Z13220 Encounter for screening for lipoid disorders: Secondary | ICD-10-CM | POA: Diagnosis not present

## 2024-05-29 NOTE — Progress Notes (Signed)
 Complete physical exam  Patient: Michelle Carr   DOB: 03-02-2001   23 y.o. Female  MRN: [id number]  Subjective:    Chief Complaint  Patient presents with   Annual Exam    Patient is here for annual physical     Michelle Carr is a 23 y.o. female who presents today for a complete physical exam. She reports consuming a general diet. Going to gym regularly She generally feels well. She reports sleeping well. She does not have additional problems to discuss today.  Had ASCUS pap in Dec 2024. Will repeat this Dec 2025. Interested in starting HPV vaccine. First dose given today.   Most recent fall risk assessment:    04/12/2023    2:09 PM  Fall Risk   Falls in the past year? 0  Number falls in past yr: 0  Injury with Fall? 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 No past surgical history on file. Social History   Socioeconomic History   Marital status: Single    Spouse name: Not on file   Number of children: Not on file   Years of education: Not on file   Highest education level: Not on file  Occupational History   Occupation: Social research officer, government  Tobacco Use   Smoking status: Never    Passive exposure: Never   Smokeless tobacco: Never  Vaping Use   Vaping status: Never Used  Substance and Sexual Activity   Alcohol use: No    Comment: occassionally   Drug use: No    Sexual activity: Yes    Birth control/protection: I.U.D.  Other Topics Concern   Not on file  Social History Narrative   Not on file   Social Drivers of Health   Financial Resource Strain: Not on file  Food Insecurity: Not on file  Transportation Needs: Not on file  Physical Activity: Not on file  Stress: Not on file  Social Connections: Not on file  Intimate Partner Violence: Not on file   Family History  Problem Relation Age of Onset   Hyperlipidemia Mother    Hyperlipidemia Maternal Grandmother    Diabetes Maternal Grandmother    Hypertension Maternal Grandmother    Allergies  Allergen Reactions   Shellfish Allergy Swelling      Patient Care Team: Colette Torrence GRADE, MD as PCP - General (Family Medicine)   Outpatient Medications Prior to Visit  Medication Sig   [DISCONTINUED] fluconazole  (DIFLUCAN ) 150 MG tablet Take one tablet by mouth as a single dose. May repeat in 3 days if symptoms persist. (Patient not taking: Reported on 11/08/2023)   No facility-administered medications prior to visit.    Review of Systems  All other systems reviewed and are negative.      Objective:  BP 103/62   Pulse 85   Temp 98.2 F (36.8 C) (Oral)   Resp 18   Ht 5' 5 (1.651 m)   Wt 246 lb 8 oz (111.8 kg)   SpO2 97%   BMI 41.02 kg/m  BP Readings from Last 3 Encounters:  05/29/24 103/62  11/08/23 99/64  08/06/23 117/74      Physical Exam Vitals and nursing note reviewed.  Constitutional:      Appearance: Normal appearance. She is obese.  HENT:     Head: Normocephalic and atraumatic.     Right Ear: Tympanic membrane, ear canal and external ear normal.     Left Ear: Tympanic membrane, ear canal and external ear normal.     Nose: Nose normal.     Mouth/Throat:     Mouth: Mucous membranes are moist.     Pharynx: Oropharynx is clear.  Eyes:     Conjunctiva/sclera: Conjunctivae normal.     Pupils: Pupils are equal, round, and reactive to light.   Cardiovascular:     Rate and Rhythm: Normal rate and regular rhythm.     Pulses: Normal pulses.     Heart sounds: Normal heart sounds.  Pulmonary:     Effort: Pulmonary effort is normal.     Breath sounds: Normal breath sounds.  Abdominal:     General: Abdomen is flat. Bowel sounds are normal.  Skin:    General: Skin is warm.     Capillary Refill: Capillary refill takes less than 2 seconds.  Neurological:     General: No focal deficit present.     Mental Status: She is alert and oriented to person, place, and time. Mental status is at baseline.  Psychiatric:        Mood and Affect: Mood normal.        Behavior: Behavior normal.        Thought Content: Thought content normal.        Judgment: Judgment normal.     No results found for any visits on 05/29/24. Last CBC Lab Results  Component Value Date   WBC 10.5 04/12/2023   HGB 14.0 04/12/2023   HCT 41.5 04/12/2023   MCV 84 04/12/2023   MCH 28.4 04/12/2023   RDW 12.3 04/12/2023   PLT 287 04/12/2023   Last metabolic panel Lab Results  Component Value Date   GLUCOSE 81 04/12/2023   NA 142 04/12/2023   K 4.2 04/12/2023   CL 103 04/12/2023   CO2 21 04/12/2023   BUN 10 04/12/2023   CREATININE 0.61 04/12/2023   EGFR 129 04/12/2023   CALCIUM 9.4 04/12/2023   PROT 7.4 04/12/2023   ALBUMIN 4.5 04/12/2023   LABGLOB 2.9 04/12/2023   AGRATIO 1.6 04/12/2023   BILITOT 0.4 04/12/2023   ALKPHOS 104 04/12/2023   AST 15 04/12/2023   ALT 16 04/12/2023   ANIONGAP 9 07/20/2020   Last lipids Lab Results  Component Value Date   CHOL 193 04/12/2023   HDL 47 04/12/2023   LDLCALC 125 (H) 04/12/2023   TRIG 118 04/12/2023   CHOLHDL 4.1 04/12/2023   Last hemoglobin A1c Lab Results  Component Value Date   HGBA1C 4.7 (L) 04/12/2023        Assessment & Plan:    Routine Health Maintenance and Physical Exam  Immunization History  Administered Date(s) Administered   DTaP 02/10/2000, 05/14/2000, 07/16/2000, 06/21/2001,  01/03/2005   HIB (PRP-OMP) 02/10/2000, 05/14/2000, 07/16/2000, 05/24/2001   Hepatitis A 01/05/2011, 07/22/2011   Hepatitis B  December 13, 1999, 03/12/2000, 09/05/2000   IPV 03/12/2000, 09/05/2000, 06/21/2001, 12/25/2003   MMR 05/24/2001, 12/25/2003   Meningococcal B, OMV 07/13/2016, 01/04/2018   Meningococcal Conjugate 01/05/2011, 07/13/2016   Pneumococcal Conjugate-13 02/10/2000, 05/14/2000, 07/16/2000   Td 01/05/2011   Tdap 01/05/2011, 01/12/2021   Varicella 05/24/2001, 01/05/2011    Health Maintenance  Topic Date Due   HPV VACCINES (1 - 3-dose series) Never done   COVID-19 Vaccine (1 - 2024-25 season) Never done   INFLUENZA VACCINE  06/28/2024   CHLAMYDIA SCREENING  11/07/2024   Cervical Cancer Screening (Pap smear)  11/07/2026   DTaP/Tdap/Td (9 - Td or Tdap) 01/12/2031   Hepatitis B Vaccines  Completed   Hepatitis C Screening  Completed   HIV Screening  Completed   Meningococcal B Vaccine  Completed   Pneumococcal Vaccine 89-50 Years old  Aged Out    Discussed health benefits of physical activity, and encouraged her to engage in regular exercise appropriate for her age and condition.  Problem List Items Addressed This Visit   None  No follow-ups on file. Annual physical exam  Encounter for lipid screening for cardiovascular disease -     Lipid panel  Impaired glucose tolerance -     CBC with Differential/Platelet -     Comprehensive metabolic panel with GFR -     Hemoglobin A1c  Need for HPV vaccination -     HPV 9-valent vaccine,Recombinat  BMI 40.0-44.9, adult (HCC)   Screening CPE labs To repeat pap in Dec 2025. HPV vaccine #1 today Return in 4 weeks for HPV #2 and discuss weight management.    Torrence CINDERELLA Barrier, MD

## 2024-05-30 ENCOUNTER — Ambulatory Visit: Payer: Self-pay | Admitting: Family Medicine

## 2024-05-30 DIAGNOSIS — R632 Polyphagia: Secondary | ICD-10-CM

## 2024-05-30 DIAGNOSIS — Z6841 Body Mass Index (BMI) 40.0 and over, adult: Secondary | ICD-10-CM

## 2024-05-30 LAB — CBC WITH DIFFERENTIAL/PLATELET
Basophils Absolute: 0 10*3/uL (ref 0.0–0.2)
Basos: 1 %
EOS (ABSOLUTE): 0.2 10*3/uL (ref 0.0–0.4)
Eos: 2 %
Hematocrit: 39.1 % (ref 34.0–46.6)
Hemoglobin: 12.7 g/dL (ref 11.1–15.9)
Immature Grans (Abs): 0 10*3/uL (ref 0.0–0.1)
Immature Granulocytes: 0 %
Lymphocytes Absolute: 2.1 10*3/uL (ref 0.7–3.1)
Lymphs: 28 %
MCH: 28.6 pg (ref 26.6–33.0)
MCHC: 32.5 g/dL (ref 31.5–35.7)
MCV: 88 fL (ref 79–97)
Monocytes Absolute: 0.5 10*3/uL (ref 0.1–0.9)
Monocytes: 6 %
Neutrophils Absolute: 4.8 10*3/uL (ref 1.4–7.0)
Neutrophils: 63 %
Platelets: 288 10*3/uL (ref 150–450)
RBC: 4.44 x10E6/uL (ref 3.77–5.28)
RDW: 12.3 % (ref 11.7–15.4)
WBC: 7.7 10*3/uL (ref 3.4–10.8)

## 2024-05-30 LAB — COMPREHENSIVE METABOLIC PANEL WITH GFR
ALT: 19 IU/L (ref 0–32)
AST: 17 IU/L (ref 0–40)
Albumin: 4.1 g/dL (ref 4.0–5.0)
Alkaline Phosphatase: 77 IU/L (ref 44–121)
BUN/Creatinine Ratio: 14 (ref 9–23)
BUN: 8 mg/dL (ref 6–20)
Bilirubin Total: 0.3 mg/dL (ref 0.0–1.2)
CO2: 24 mmol/L (ref 20–29)
Calcium: 9.3 mg/dL (ref 8.7–10.2)
Chloride: 104 mmol/L (ref 96–106)
Creatinine, Ser: 0.57 mg/dL (ref 0.57–1.00)
Globulin, Total: 2.7 g/dL (ref 1.5–4.5)
Glucose: 88 mg/dL (ref 70–99)
Potassium: 3.9 mmol/L (ref 3.5–5.2)
Sodium: 142 mmol/L (ref 134–144)
Total Protein: 6.8 g/dL (ref 6.0–8.5)
eGFR: 130 mL/min/{1.73_m2} (ref >59)

## 2024-05-30 LAB — HEMOGLOBIN A1C
Est. average glucose Bld gHb Est-mCnc: 88 mg/dL
Hgb A1c MFr Bld: 4.7 % — ABNORMAL LOW (ref 4.8–5.6)

## 2024-05-30 LAB — LIPID PANEL
Chol/HDL Ratio: 5 ratio — ABNORMAL HIGH (ref 0.0–4.4)
Cholesterol, Total: 174 mg/dL (ref 100–199)
HDL: 35 mg/dL — ABNORMAL LOW (ref >39)
LDL Chol Calc (NIH): 106 mg/dL — ABNORMAL HIGH (ref 0–99)
Triglycerides: 186 mg/dL — ABNORMAL HIGH (ref 0–149)
VLDL Cholesterol Cal: 33 mg/dL (ref 5–40)

## 2024-05-30 MED ORDER — BUPROPION HCL ER (XL) 150 MG PO TB24
150.0000 mg | ORAL_TABLET | Freq: Every day | ORAL | 1 refills | Status: DC
Start: 2024-05-30 — End: 2024-06-26

## 2024-06-26 ENCOUNTER — Ambulatory Visit: Admitting: Family Medicine

## 2024-06-26 ENCOUNTER — Encounter: Payer: Self-pay | Admitting: Family Medicine

## 2024-06-26 VITALS — BP 97/65 | HR 75 | Temp 98.0°F | Resp 18 | Ht 65.0 in | Wt 245.8 lb

## 2024-06-26 DIAGNOSIS — Z23 Encounter for immunization: Secondary | ICD-10-CM | POA: Diagnosis not present

## 2024-06-26 DIAGNOSIS — Z6841 Body Mass Index (BMI) 40.0 and over, adult: Secondary | ICD-10-CM | POA: Diagnosis not present

## 2024-06-26 DIAGNOSIS — R632 Polyphagia: Secondary | ICD-10-CM | POA: Diagnosis not present

## 2024-06-26 MED ORDER — BUPROPION HCL ER (XL) 150 MG PO TB24: 150.0000 mg | ORAL_TABLET | Freq: Every day | ORAL | 1 refills | Status: AC

## 2024-06-26 NOTE — Progress Notes (Signed)
 Established Patient Office Visit  Subjective   Patient ID: Michelle Carr, female    DOB: 2001/11/16  Age: 23 y.o. MRN: [id number]  Chief Complaint  Patient presents with   Follow-up    Patient is here for a 1 month weight management follow up as well as her 2nd HPV vaccine    HPI  Weight management Pt is here for 4 week follow up of weight management. She was started on Bupropion  150mg  daily 4 weeks ago. Tolerating medicine well. Feels like she hasn't craved as much as she use to. She has cut back on starch/carbs and drinking more water. She reports she has no time for exercise. Lost 1 lb since last visit. Pt would like to stay on Bupropion  150mg  for now and needs this refilled.   Pt is due for her HPV vaccine #2  Review of Systems  All other systems reviewed and are negative.     Objective:     BP 97/65   Pulse 75   Temp 98 F (36.7 C) (Oral)   Resp 18   Ht 5' 5 (1.651 m)   Wt 245 lb 12.8 oz (111.5 kg)   SpO2 99%   BMI 40.90 kg/m  BP Readings from Last 3 Encounters:  06/26/24 97/65  05/29/24 103/62  11/08/23 99/64      Physical Exam Vitals and nursing note reviewed.  Constitutional:      Appearance: Normal appearance. She is normal weight.  HENT:     Head: Normocephalic and atraumatic.     Right Ear: External ear normal.     Left Ear: External ear normal.     Nose: Nose normal.     Mouth/Throat:     Mouth: Mucous membranes are moist.     Pharynx: Oropharynx is clear.  Eyes:     Conjunctiva/sclera: Conjunctivae normal.     Pupils: Pupils are equal, round, and reactive to light.  Cardiovascular:     Rate and Rhythm: Normal rate.  Pulmonary:     Effort: Pulmonary effort is normal.  Skin:    General: Skin is warm.     Capillary Refill: Capillary refill takes less than 2 seconds.  Neurological:     General: No focal deficit present.     Mental Status: She is alert and oriented to person, place, and time. Mental status is at baseline.  Psychiatric:         Mood and Affect: Mood normal.        Behavior: Behavior normal.        Thought Content: Thought content normal.        Judgment: Judgment normal.      No results found for any visits on 06/26/24.     The ASCVD Risk score (Arnett DK, et al., 2019) failed to calculate for the following reasons:   The 2019 ASCVD risk score is only valid for ages 44 to 29    Assessment & Plan:   Problem List Items Addressed This Visit   None Visit Diagnoses       Polyphagia    -  Primary   Relevant Medications   buPROPion  (WELLBUTRIN  XL) 150 MG 24 hr tablet     BMI 40.0-44.9, adult (HCC)       Relevant Medications   buPROPion  (WELLBUTRIN  XL) 150 MG 24 hr tablet     Need for HPV vaccination       Relevant Orders   HPV 9-valent vaccine,Recombinat     To  continue on Bupropion  150mg  daily for weight management and polyphagia. Refilled today. HPV #2 today Return in 6 months for follow up and HPV #3.  Return in about 6 months (around 12/27/2024) for Weight Management/HPV #3.    Torrence CINDERELLA Barrier, MD

## 2024-09-03 ENCOUNTER — Ambulatory Visit (INDEPENDENT_AMBULATORY_CARE_PROVIDER_SITE_OTHER)

## 2024-09-03 ENCOUNTER — Other Ambulatory Visit (HOSPITAL_COMMUNITY)
Admission: RE | Admit: 2024-09-03 | Discharge: 2024-09-03 | Disposition: A | Discharge status: Home / Self Care | Source: Ambulatory Visit | Attending: Obstetrics | Admitting: Obstetrics

## 2024-09-03 VITALS — BP 104/79 | HR 73

## 2024-09-03 DIAGNOSIS — Z113 Encounter for screening for infections with a predominantly sexual mode of transmission: Secondary | ICD-10-CM | POA: Diagnosis present

## 2024-09-03 NOTE — Progress Notes (Signed)
 SUBJECTIVE:  23 y.o. female who desires a STI screen. Denies abnormal vaginal discharge, bleeding or significant pelvic pain. No UTI symptoms. Denies history of known exposure to STD.  No LMP recorded. (Menstrual status: [name]).  OBJECTIVE:  She appears well.   ASSESSMENT:  STI Screen   PLAN:  Pt offered STI blood screening-requested GC, chlamydia, and trichomonas probe sent to lab.  Treatment: To be determined once lab results are received.  Pt follow up as needed.

## 2024-09-04 ENCOUNTER — Ambulatory Visit: Payer: Self-pay | Admitting: Obstetrics and Gynecology

## 2024-09-04 LAB — RPR: RPR Ser Ql: NONREACTIVE

## 2024-09-04 LAB — HIV ANTIBODY (ROUTINE TESTING W REFLEX): HIV Screen 4th Generation wRfx: NONREACTIVE

## 2024-09-04 LAB — HEPATITIS B SURFACE ANTIGEN: Hepatitis B Surface Ag: NEGATIVE

## 2024-09-04 LAB — HEPATITIS C ANTIBODY: Hep C Virus Ab: NONREACTIVE

## 2024-09-05 LAB — CERVICOVAGINAL ANCILLARY ONLY
Bacterial Vaginitis (gardnerella): NEGATIVE
Candida Glabrata: NEGATIVE
Candida Vaginitis: NEGATIVE
Chlamydia: POSITIVE — AB
Comment: NEGATIVE
Comment: NEGATIVE
Comment: NEGATIVE
Comment: NEGATIVE
Comment: NEGATIVE
Comment: NORMAL
Neisseria Gonorrhea: NEGATIVE
Trichomonas: NEGATIVE

## 2024-09-06 MED ORDER — DOXYCYCLINE HYCLATE 100 MG PO CAPS: 100.0000 mg | ORAL_CAPSULE | Freq: Two times a day (BID) | ORAL | 0 refills | Status: AC

## 2024-09-06 NOTE — Telephone Encounter (Signed)
 Patient called in after seeing +CT results, rx sent per protocol, advised of partner testing and treatment, abstinence x 1 week after treatment, and follow up testing, pt voiced understanding.

## 2024-09-10 ENCOUNTER — Encounter

## 2024-09-10 ENCOUNTER — Encounter: Payer: Self-pay | Admitting: Obstetrics

## 2024-10-16 ENCOUNTER — Ambulatory Visit: Admitting: Physician Assistant

## 2024-10-16 ENCOUNTER — Other Ambulatory Visit (HOSPITAL_COMMUNITY)
Admission: RE | Admit: 2024-10-16 | Discharge: 2024-10-16 | Disposition: A | Discharge status: Home / Self Care | Source: Ambulatory Visit | Attending: Physician Assistant | Admitting: Physician Assistant

## 2024-10-16 VITALS — BP 109/71 | HR 80 | Wt 244.0 lb

## 2024-10-16 DIAGNOSIS — Z113 Encounter for screening for infections with a predominantly sexual mode of transmission: Secondary | ICD-10-CM | POA: Diagnosis not present

## 2024-10-16 DIAGNOSIS — Z8619 Personal history of other infectious and parasitic diseases: Secondary | ICD-10-CM

## 2024-10-16 DIAGNOSIS — Z124 Encounter for screening for malignant neoplasm of cervix: Secondary | ICD-10-CM

## 2024-10-16 NOTE — Progress Notes (Addendum)
 Encounter turned into nurse visit.

## 2024-10-17 LAB — CERVICOVAGINAL ANCILLARY ONLY
Chlamydia: NEGATIVE
Comment: NEGATIVE
Comment: NEGATIVE
Comment: NORMAL
Neisseria Gonorrhea: NEGATIVE
Trichomonas: NEGATIVE

## 2024-10-20 ENCOUNTER — Ambulatory Visit: Payer: Self-pay | Admitting: Physician Assistant

## 2024-11-09 ENCOUNTER — Telehealth: Admitting: Nurse Practitioner

## 2024-11-09 DIAGNOSIS — L2084 Intrinsic (allergic) eczema: Secondary | ICD-10-CM

## 2024-11-09 MED ORDER — TRIAMCINOLONE ACETONIDE 0.1 % EX CREA: 1.0000 | TOPICAL_CREAM | Freq: Two times a day (BID) | CUTANEOUS | 0 refills | Status: AC

## 2024-11-09 NOTE — Progress Notes (Signed)

## 2024-11-27 ENCOUNTER — Ambulatory Visit: Admitting: Physician Assistant

## 2024-11-29 NOTE — Progress Notes (Signed)
 Seen by patient Michelle Carr on 10/20/2024  8:45 PM

## 2025-01-01 ENCOUNTER — Ambulatory Visit: Admitting: Family Medicine

## 2025-01-22 ENCOUNTER — Ambulatory Visit: Admitting: Family Medicine
